# Patient Record
Sex: Male | Born: 1960 | Hispanic: No | Marital: Married | Smoking: Never smoker
Health system: Southern US, Community
[De-identification: ages and names within clinical notes are randomized; demographics above are authoritative.]

## PROBLEM LIST (undated history)

## (undated) DIAGNOSIS — G4730 Sleep apnea, unspecified: Secondary | ICD-10-CM

## (undated) HISTORY — PX: NO PAST SURGERIES: SHX2092

## (undated) HISTORY — PX: WRIST SURGERY: SHX841

---

## 2015-07-26 ENCOUNTER — Telehealth: Payer: Self-pay | Admitting: Internal Medicine

## 2015-07-26 NOTE — Telephone Encounter (Signed)
Pt called stated he was refer from Dr. Leeroy Bockhoudry to Dr. Yetta BarreJones as a new pt. Please advise.

## 2015-07-30 NOTE — Telephone Encounter (Signed)
yes

## 2015-07-31 NOTE — Telephone Encounter (Signed)
Left vm for pt to call back to make a new pt appt with Dr. Yetta BarreJones.

## 2015-08-28 ENCOUNTER — Encounter: Payer: Self-pay | Admitting: Internal Medicine

## 2018-06-18 DIAGNOSIS — Z01818 Encounter for other preprocedural examination: Secondary | ICD-10-CM

## 2019-03-27 ENCOUNTER — Other Ambulatory Visit: Payer: Self-pay | Admitting: *Deleted

## 2019-03-27 DIAGNOSIS — Z1152 Encounter for screening for COVID-19: Secondary | ICD-10-CM

## 2019-03-28 LAB — NOVEL CORONAVIRUS, NAA: SARS-CoV-2, NAA: DETECTED — AB

## 2019-03-28 LAB — SPECIMEN STATUS REPORT

## 2019-03-29 ENCOUNTER — Telehealth: Payer: Self-pay | Admitting: Family Medicine

## 2019-03-29 NOTE — Telephone Encounter (Signed)
Attempted to notify patient of positive COVID 19 results, no answer.   COVID-19 Labs  No results for input(s): DDIMER, FERRITIN, LDH, CRP in the last 72 hours.  Lab Results  Component Value Date   SARSCOV2NAA Detected (A) 03/27/2019    Nolon Nations  APRN, MSN, FNP-C [facility] [address] [place], [place] [address] [phone]

## 2019-04-02 ENCOUNTER — Emergency Department (HOSPITAL_COMMUNITY)
Admission: EM | Admit: 2019-04-02 | Discharge: 2019-04-03 | Disposition: A | Discharge status: Home / Self Care | Payer: BC Managed Care – PPO | Attending: Emergency Medicine | Admitting: Emergency Medicine

## 2019-04-02 ENCOUNTER — Emergency Department (HOSPITAL_COMMUNITY): Payer: BC Managed Care – PPO

## 2019-04-02 ENCOUNTER — Encounter (HOSPITAL_COMMUNITY): Payer: Self-pay | Admitting: Emergency Medicine

## 2019-04-02 ENCOUNTER — Other Ambulatory Visit: Payer: Self-pay

## 2019-04-02 DIAGNOSIS — J029 Acute pharyngitis, unspecified: Secondary | ICD-10-CM | POA: Diagnosis present

## 2019-04-02 DIAGNOSIS — Z79899 Other long term (current) drug therapy: Secondary | ICD-10-CM | POA: Diagnosis not present

## 2019-04-02 DIAGNOSIS — U071 COVID-19: Secondary | ICD-10-CM | POA: Diagnosis not present

## 2019-04-02 DIAGNOSIS — Z7982 Long term (current) use of aspirin: Secondary | ICD-10-CM | POA: Insufficient documentation

## 2019-04-02 LAB — CBC WITH DIFFERENTIAL/PLATELET
Abs Immature Granulocytes: 0.05 10*3/uL (ref 0.00–0.07)
Basophils Absolute: 0 10*3/uL (ref 0.0–0.1)
Basophils Relative: 0 %
Eosinophils Absolute: 0 10*3/uL (ref 0.0–0.5)
Eosinophils Relative: 0 %
HCT: 46.4 % (ref 39.0–52.0)
Hemoglobin: 16 g/dL (ref 13.0–17.0)
Immature Granulocytes: 1 %
Lymphocytes Relative: 15 %
Lymphs Abs: 1.3 10*3/uL (ref 0.7–4.0)
MCH: 28.7 pg (ref 26.0–34.0)
MCHC: 34.5 g/dL (ref 30.0–36.0)
MCV: 83.3 fL (ref 80.0–100.0)
Monocytes Absolute: 0.4 10*3/uL (ref 0.1–1.0)
Monocytes Relative: 4 %
Neutro Abs: 7.4 10*3/uL (ref 1.7–7.7)
Neutrophils Relative %: 80 %
Platelets: 230 10*3/uL (ref 150–400)
RBC: 5.57 MIL/uL (ref 4.22–5.81)
RDW: 13 % (ref 11.5–15.5)
WBC: 9.2 10*3/uL (ref 4.0–10.5)
nRBC: 0 % (ref 0.0–0.2)

## 2019-04-02 LAB — COMPREHENSIVE METABOLIC PANEL
ALT: 26 U/L (ref 0–44)
AST: 18 U/L (ref 15–41)
Albumin: 4.1 g/dL (ref 3.5–5.0)
Alkaline Phosphatase: 67 U/L (ref 38–126)
Anion gap: 10 (ref 5–15)
BUN: 19 mg/dL (ref 6–20)
CO2: 23 mmol/L (ref 22–32)
Calcium: 8.9 mg/dL (ref 8.9–10.3)
Chloride: 103 mmol/L (ref 98–111)
Creatinine, Ser: 1.15 mg/dL (ref 0.61–1.24)
GFR calc Af Amer: >60 mL/min (ref >60)
GFR calc non Af Amer: >60 mL/min (ref >60)
Glucose, Bld: 125 mg/dL — ABNORMAL HIGH (ref 70–99)
Potassium: 3.8 mmol/L (ref 3.5–5.1)
Sodium: 136 mmol/L (ref 135–145)
Total Bilirubin: 1 mg/dL (ref 0.3–1.2)
Total Protein: 7.1 g/dL (ref 6.5–8.1)

## 2019-04-02 LAB — FERRITIN: Ferritin: 156 ng/mL (ref 24–336)

## 2019-04-02 LAB — PROCALCITONIN: Procalcitonin: <0.1 ng/mL

## 2019-04-02 LAB — LACTATE DEHYDROGENASE: LDH: 120 U/L (ref 98–192)

## 2019-04-02 LAB — C-REACTIVE PROTEIN: CRP: 0.8 mg/dL (ref <1.0)

## 2019-04-02 LAB — FIBRINOGEN: Fibrinogen: 446 mg/dL (ref 210–475)

## 2019-04-02 LAB — D-DIMER, QUANTITATIVE: D-Dimer, Quant: <0.27 ug/mL-FEU (ref 0.00–0.50)

## 2019-04-02 LAB — LACTIC ACID, PLASMA: Lactic Acid, Venous: 1 mmol/L (ref 0.5–1.9)

## 2019-04-02 LAB — TRIGLYCERIDES: Triglycerides: 62 mg/dL (ref <150)

## 2019-04-02 NOTE — ED Notes (Signed)
Pt ambulated independently with steady gait from stretcher to hospital stretcher

## 2019-04-02 NOTE — ED Provider Notes (Signed)
[facility]-EMERGENCY DEPT Provider Note   CSN: [id number] Arrival date & time: 04/02/19  2000     History Chief Complaint  Patient presents with  . Covid Symptoms    Gordon Rosales is a 59 y.o. male.  He has a history of bladder cancer.  He was exposed to Covid on January 2 and a few days later became symptomatic with sore throat.  His Covid test was positive 1 week ago.  He is complaining of continued cough shortness of breath fevers myalgias arthralgias.  He is primary care doctor is put him on Zithromax Decadron zinc vitamin C. he continues to worsen with more shortness of breath and cough and he said his saturations dropped to 91% today.  He had a CAT scan today with his primary care doctor in [place] who told him he has bilateral pneumonia.  They recommended he come to the emergency department.   The history is provided by the patient.  Shortness of Breath Severity:  Severe Onset quality:  Gradual Timing:  Intermittent Progression:  Worsening Chronicity:  New Context comment:  Covid Relieved by:  Nothing Worsened by:  Activity and coughing Ineffective treatments:  Rest Associated symptoms: cough, fever, headaches and sore throat   Associated symptoms: no abdominal pain, no chest pain and no rash        History reviewed. No pertinent past medical history. Bladder CA  There are no problems to display for this patient.   History reviewed. No pertinent surgical history.     No family history on file.  Social History   Tobacco Use  . Smoking status: Not on file  Substance Use Topics  . Alcohol use: Not on file  . Drug use: Not on file    Home Medications Prior to Admission medications   Medication Sig Start Date End Date Taking? Authorizing Provider  acetaminophen (TYLENOL) 325 MG tablet Take 650 mg by mouth every 6 (six) hours as needed for moderate pain.   Yes [provider]  Ascorbic Acid (VITAMIN C WITH ROSE HIPS) 500 MG  tablet Take 500 mg by mouth daily.   Yes [provider]  aspirin EC 81 MG tablet Take 81 mg by mouth daily.   Yes [provider]  azithromycin (ZITHROMAX) 500 MG tablet Take 500 mg by mouth daily. 04/02/19  Yes [provider]  D3-50 1.25 MG (50000 UT) capsule Take 50,000 Units by mouth once a week. 02/16/19  Yes [provider]  dexamethasone (DECADRON) 6 MG tablet Take 6 mg by mouth daily.  03/30/19  Yes [provider]  finasteride (PROSCAR) 5 MG tablet Take 5 mg by mouth daily.  03/03/19 06/01/19 Yes [provider]  solifenacin (VESICARE) 5 MG tablet Take 5 mg by mouth daily. 03/17/19  Yes [provider]  Zinc 50 MG TABS Take 50 mg by mouth daily.   Yes [provider]    Allergies    Penicillins  Review of Systems   Review of Systems  Constitutional: Positive for fever.  HENT: Positive for sore throat.   Eyes: Negative for visual disturbance.  Respiratory: Positive for cough and shortness of breath.   Cardiovascular: Negative for chest pain.  Gastrointestinal: Negative for abdominal pain.  Genitourinary: Negative for dysuria.  Musculoskeletal: Positive for myalgias.  Skin: Negative for rash.  Neurological: Positive for headaches.    Physical Exam Updated Vital Signs BP 120/77 (BP Location: Left Arm)   Pulse 61   Temp 98.6 F (  37 C) (Oral)   Resp 20   Ht 6\' 3"  (1.905 m)   Wt 106.6 kg   SpO2 97%   BMI 29.37 kg/m   Physical Exam Vitals and nursing note reviewed.  Constitutional:      Appearance: He is well-developed.  HENT:     Head: Normocephalic and atraumatic.  Eyes:     Conjunctiva/sclera: Conjunctivae normal.  Cardiovascular:     Rate and Rhythm: Normal rate and regular rhythm.     Pulses: Normal pulses.     Heart sounds: No murmur.  Pulmonary:     Effort: Pulmonary effort is normal. No respiratory distress.     Breath sounds: Normal breath sounds.  Abdominal:     Palpations: Abdomen  is soft.     Tenderness: There is no abdominal tenderness.  Musculoskeletal:        General: Normal range of motion.     Cervical back: Neck supple.     Right lower leg: No edema.     Left lower leg: No edema.  Skin:    General: Skin is warm and dry.     Capillary Refill: Capillary refill takes less than 2 seconds.  Neurological:     General: No focal deficit present.     Mental Status: He is alert.     ED Results / Procedures / Treatments   Labs (all labs ordered are listed, but only abnormal results are displayed) Labs Reviewed  COMPREHENSIVE METABOLIC PANEL - Abnormal; Notable for the following components:      Result Value   Glucose, Bld 125 (*)    All other components within normal limits  CULTURE, BLOOD (ROUTINE X 2)  CULTURE, BLOOD (ROUTINE X 2)  LACTIC ACID, PLASMA  CBC WITH DIFFERENTIAL/PLATELET  D-DIMER, QUANTITATIVE (NOT AT [facility])  PROCALCITONIN  LACTATE DEHYDROGENASE  FERRITIN  TRIGLYCERIDES  FIBRINOGEN  C-REACTIVE PROTEIN    EKG EKG Interpretation  Date/Time:  Friday April 02 2019 20:39:39 EST Ventricular Rate:  58 PR Interval:    QRS Duration: 112 QT Interval:  432 QTC Calculation: 425 R Axis:   -25 Text Interpretation: Sinus rhythm Borderline intraventricular conduction delay No old tracing to compare Confirmed by 12-01-1972 [phone]) on 04/02/2019 9:32:44 PM   Radiology DG Chest Port 1 View  Result Date: 04/02/2019 CLINICAL DATA:  Shortness of breath and history of COVID-19 positivity EXAM: PORTABLE CHEST 1 VIEW COMPARISON:  CT from 4 hours previous FINDINGS: Cardiac shadow is stable. The lungs are well aerated bilaterally. The hazy opacity seen on recent CT are not well appreciated on this exam consistent with their mild severity. No bony abnormality is seen. IMPRESSION: No acute abnormality is noted. The previously seen hazy opacities consistent with the given clinical history were mild in severity on recent CT. Electronically Signed   By:  04/04/2019 M.D.   On: 04/02/2019 21:44    Procedures Procedures (including critical care time)  Medications Ordered in ED Medications - No data to display  ED Course  I have reviewed the triage vital signs and the nursing notes.  Pertinent labs & imaging results that were available during my care of the patient were reviewed by me and considered in my medical decision making (see chart for details).  Clinical Course as of Apr 03 1015  Fri Apr 02, 2019  [id number] 59 year old male with history of bladder cancer here with recent Covid positive and worsening shortness of breath symptoms.  He is currently on steroids and had a  CTA chest today that showed no evidence of PE but hazy opacities consistent with atypical pneumonia/Covid   [MB]  Sat Apr 03, 2019  0006 All the patient's inflammatory markers are unremarkable and his oxygen saturations have been 95 to 100%.  I reviewed the work-up with him.  I will call into the [facility] infusion center with his medical record number to see if he be a candidate for plasma.   [MB]    Clinical Course User Index [MB] Hayden Rasmussen, MD   MDM Rules/Calculators/A&P                     Gordon Rosales was evaluated in Emergency Department on 04/02/2019 for the symptoms described in the history of present illness. He was evaluated in the context of the global COVID-19 pandemic, which necessitated consideration that the patient might be at risk for infection with the SARS-CoV-2 virus that causes COVID-19. Institutional protocols and algorithms that pertain to the evaluation of patients at risk for COVID-19 are in a state of rapid change based on information released by regulatory bodies including the CDC and federal and state organizations. These policies and algorithms were followed during the patient's care in the ED.   Final Clinical Impression(s) / ED Diagnoses Final diagnoses:  COVID-19 virus infection    Rx / DC Orders ED Discharge Orders     None       Hayden Rasmussen, MD 04/03/19 1018

## 2019-04-02 NOTE — ED Triage Notes (Signed)
Pt diagnosed COVID+ x 1 week ago. Pt today was diagnosised with bilateral pneumonia by PCP. Pt stated to EMS "I want to go to hospital to have lab work done". Pt is being treated for pneumonia by PCP.   110 70 60 HR 20 R 98% RA 97.7

## 2019-04-02 NOTE — ED Notes (Signed)
X-ray at bedside

## 2019-04-03 ENCOUNTER — Telehealth: Payer: Self-pay | Admitting: Physician Assistant

## 2019-04-03 DIAGNOSIS — Z8551 Personal history of malignant neoplasm of bladder: Secondary | ICD-10-CM

## 2019-04-03 DIAGNOSIS — U071 COVID-19: Secondary | ICD-10-CM | POA: Insufficient documentation

## 2019-04-03 NOTE — Telephone Encounter (Signed)
Called to discuss with Gordon Rosales about Covid symptoms and the use of bamlanivimab, a monoclonal antibody infusion for those with mild to moderate Covid symptoms and at a high risk of hospitalization.    Pt does not qualify for infusion therapy as symptoms first presented > 10 days prior to timing of infusion. Symptoms tier reviewed as well as criteria for ending isolation. Preventative practices reviewed. Patient verbalized understanding.  The patient has been treated with steroid and azithromycin by PCP. He was seen in the ER last night for worsening symptoms but did not meet criteria for admission. He is still feeling quite poorly with fevers, myalgia and cough. I offered to make him an appointment in our respiratory clinic if he has clinical worsening or feels like he needs to be reevaluated in person. He asked me to CC Dr. Charm Barges from the ER and his PCP, Dr. Marcellus Scott.    Patient Active Problem List   Diagnosis Date Noted   History of bladder cancer 04/03/2019   COVID-19 virus detected 04/03/2019    Cline Crock PA-C  [facility]

## 2019-04-03 NOTE — Discharge Instructions (Addendum)
You were evaluated in the emergency department for worsening symptoms related to your Covid infection.  Your blood work showed that your inflammatory markers were in the normal range and your oxygen was 95 to 100%.  I placed a referral into the [facility] Covid infusion center to see if you would be a candidate for plasma infusion.  You can reach their clinic at [phone] and leave a message.   Please continue to manage fevers and pain with Tylenol and drink plenty of fluids.  Return to the emergency department if any worsening shortness of breath or other concerning symptoms.

## 2019-04-05 ENCOUNTER — Telehealth: Payer: Self-pay | Admitting: Nurse Practitioner

## 2019-04-05 NOTE — Telephone Encounter (Signed)
Called to Discuss with patient about Covid symptoms and the use of bamlanivimab, a monoclonal antibody infusion for those with mild to moderate Covid symptoms and at a high risk of hospitalization.     Patient is more than 10 days out on symptoms. Does not meet criteria for infusion.

## 2019-04-06 ENCOUNTER — Other Ambulatory Visit: Payer: Self-pay | Admitting: Cardiology

## 2019-04-06 DIAGNOSIS — Z20822 Contact with and (suspected) exposure to covid-19: Secondary | ICD-10-CM

## 2019-04-07 LAB — CULTURE, BLOOD (ROUTINE X 2)
Culture: NO GROWTH
Culture: NO GROWTH
Special Requests: ADEQUATE
Special Requests: ADEQUATE

## 2019-04-07 LAB — NOVEL CORONAVIRUS, NAA: SARS-CoV-2, NAA: DETECTED — AB

## 2020-10-15 ENCOUNTER — Emergency Department (HOSPITAL_COMMUNITY)
Admission: EM | Admit: 2020-10-15 | Discharge: 2020-10-15 | Disposition: A | Discharge status: Home / Self Care | Payer: BC Managed Care – PPO | Attending: Emergency Medicine | Admitting: Emergency Medicine

## 2020-10-15 ENCOUNTER — Other Ambulatory Visit: Payer: Self-pay

## 2020-10-15 ENCOUNTER — Emergency Department (HOSPITAL_BASED_OUTPATIENT_CLINIC_OR_DEPARTMENT_OTHER): Payer: BC Managed Care – PPO

## 2020-10-15 ENCOUNTER — Emergency Department (HOSPITAL_COMMUNITY): Payer: BC Managed Care – PPO

## 2020-10-15 ENCOUNTER — Encounter (HOSPITAL_COMMUNITY): Payer: Self-pay | Admitting: Student

## 2020-10-15 DIAGNOSIS — M79604 Pain in right leg: Secondary | ICD-10-CM | POA: Diagnosis not present

## 2020-10-15 DIAGNOSIS — Z8616 Personal history of COVID-19: Secondary | ICD-10-CM | POA: Insufficient documentation

## 2020-10-15 DIAGNOSIS — Z8551 Personal history of malignant neoplasm of bladder: Secondary | ICD-10-CM | POA: Insufficient documentation

## 2020-10-15 DIAGNOSIS — M79661 Pain in right lower leg: Secondary | ICD-10-CM | POA: Diagnosis not present

## 2020-10-15 DIAGNOSIS — R918 Other nonspecific abnormal finding of lung field: Secondary | ICD-10-CM

## 2020-10-15 DIAGNOSIS — R911 Solitary pulmonary nodule: Secondary | ICD-10-CM | POA: Diagnosis not present

## 2020-10-15 DIAGNOSIS — R001 Bradycardia, unspecified: Secondary | ICD-10-CM | POA: Insufficient documentation

## 2020-10-15 DIAGNOSIS — M7989 Other specified soft tissue disorders: Secondary | ICD-10-CM | POA: Diagnosis not present

## 2020-10-15 DIAGNOSIS — Z7982 Long term (current) use of aspirin: Secondary | ICD-10-CM | POA: Diagnosis not present

## 2020-10-15 DIAGNOSIS — R0602 Shortness of breath: Secondary | ICD-10-CM | POA: Diagnosis not present

## 2020-10-15 LAB — CBC WITH DIFFERENTIAL/PLATELET
Abs Immature Granulocytes: 0.03 10*3/uL (ref 0.00–0.07)
Basophils Absolute: 0 10*3/uL (ref 0.0–0.1)
Basophils Relative: 1 %
Eosinophils Absolute: 0.2 10*3/uL (ref 0.0–0.5)
Eosinophils Relative: 3 %
HCT: 47.5 % (ref 39.0–52.0)
Hemoglobin: 16 g/dL (ref 13.0–17.0)
Immature Granulocytes: 1 %
Lymphocytes Relative: 45 %
Lymphs Abs: 2.3 10*3/uL (ref 0.7–4.0)
MCH: 28.4 pg (ref 26.0–34.0)
MCHC: 33.7 g/dL (ref 30.0–36.0)
MCV: 84.2 fL (ref 80.0–100.0)
Monocytes Absolute: 0.4 10*3/uL (ref 0.1–1.0)
Monocytes Relative: 8 %
Neutro Abs: 2.2 10*3/uL (ref 1.7–7.7)
Neutrophils Relative %: 42 %
Platelets: 206 10*3/uL (ref 150–400)
RBC: 5.64 MIL/uL (ref 4.22–5.81)
RDW: 13.9 % (ref 11.5–15.5)
WBC: 5.2 10*3/uL (ref 4.0–10.5)
nRBC: 0 % (ref 0.0–0.2)

## 2020-10-15 LAB — BASIC METABOLIC PANEL
Anion gap: 7 (ref 5–15)
BUN: 22 mg/dL (ref 8–23)
CO2: 26 mmol/L (ref 22–32)
Calcium: 9.4 mg/dL (ref 8.9–10.3)
Chloride: 104 mmol/L (ref 98–111)
Creatinine, Ser: 1.21 mg/dL (ref 0.61–1.24)
GFR, Estimated: >60 mL/min (ref >60)
Glucose, Bld: 104 mg/dL — ABNORMAL HIGH (ref 70–99)
Potassium: 4.2 mmol/L (ref 3.5–5.1)
Sodium: 137 mmol/L (ref 135–145)

## 2020-10-15 LAB — TROPONIN I (HIGH SENSITIVITY)
Troponin I (High Sensitivity): 6 ng/L (ref <18)
Troponin I (High Sensitivity): 3 ng/L (ref <18)
Troponin I (High Sensitivity): 3 ng/L (ref <18)

## 2020-10-15 LAB — D-DIMER, QUANTITATIVE: D-Dimer, Quant: 0.48 ug/mL-FEU (ref 0.00–0.50)

## 2020-10-15 MED ORDER — IOHEXOL 350 MG/ML SOLN
100.0000 mL | Freq: Once | INTRAVENOUS | Status: AC | PRN
  Administered 2020-10-15: 100 mL via INTRAVENOUS

## 2020-10-15 NOTE — ED Provider Notes (Signed)
[facility] EMERGENCY DEPARTMENT Provider Note   CSN: [id number] Arrival date & time: 10/15/20  0130     History Chief Complaint  Patient presents with   Shortness of Breath    Gordon Rosales is a 60 y.o. male with a hx of bladder cancer on CBG therapy who presents to the ED with complaints of RLE pain/swelling x 3 days and intermittent dyspnea x 1 week. Patient reports that 3 days ago he drove from [place] to [place] which took about 13 hours with some stops, during the drive he started to experience RLE pain/swelling from the knee down therefore his wife took over driving. Since he has continued pain/swelling but it has seemed to improve with massaging the area. He has also had intermittent dyspnea with trouble clearing his throat/coughing over the past week, no specific triggers or alleviating/aggravating factors, he has had some chest heaviness with these sxs after eating last night which improved with OTC antiacid, sxs are not specific to exertion. He called his pulmonologist who instructed him to come to the ED for evaluation for possible blood clot. Recently had COVID 19 for the 2nd time diagnosed 09/19/20- received paxlovid and recovered well. He denies fever, nausea, vomiting, hemoptysis, syncope, hx of VTE, recent surgery, or recent trauma.   HPI     No past medical history on file.  Patient Active Problem List   Diagnosis Date Noted   History of bladder cancer 04/03/2019   COVID-19 virus detected 04/03/2019    No past surgical history on file.     No family history on file.     Home Medications Prior to Admission medications   Medication Sig Start Date End Date Taking? Authorizing Provider  acetaminophen (TYLENOL) 325 MG tablet Take 650 mg by mouth every 6 (six) hours as needed for moderate pain.    [provider]  Ascorbic Acid (VITAMIN C WITH ROSE HIPS) 500 MG tablet Take 500 mg by mouth daily.    [provider]   aspirin EC 81 MG tablet Take 81 mg by mouth daily.    [provider]  azithromycin (ZITHROMAX) 500 MG tablet Take 500 mg by mouth daily. 04/02/19   [provider]  D3-50 1.25 MG (50000 UT) capsule Take 50,000 Units by mouth once a week. 02/16/19   [provider]  dexamethasone (DECADRON) 6 MG tablet Take 6 mg by mouth daily.  03/30/19   [provider]  solifenacin (VESICARE) 5 MG tablet Take 5 mg by mouth daily. 03/17/19   [provider]  Zinc 50 MG TABS Take 50 mg by mouth daily.    [provider]    Allergies    Penicillins  Review of Systems   Review of Systems  Constitutional:  Negative for chills and fever.  Respiratory:  Positive for cough, chest tightness and shortness of breath.   Gastrointestinal:  Negative for abdominal pain, nausea and vomiting.  Musculoskeletal:  Positive for myalgias.  Neurological:  Negative for syncope.  All other systems reviewed and are negative.  Physical Exam Updated Vital Signs BP 117/87   Pulse (!) 50   Temp 98.6 F (37 C)   Resp 14   Ht 6' (1.829 m)   Wt 106.6 kg   SpO2 98%   BMI 31.87 kg/m   Physical Exam Vitals and nursing note reviewed.  Constitutional:      General: He is not in acute distress.    Appearance: He is well-developed. He  is not ill-appearing or toxic-appearing.  HENT:     Head: Normocephalic and atraumatic.  Eyes:     General:        Right eye: No discharge.        Left eye: No discharge.     Conjunctiva/sclera: Conjunctivae normal.  Cardiovascular:     Rate and Rhythm: Regular rhythm. Bradycardia present.     Pulses:          Dorsalis pedis pulses are 2+ on the right side and 2+ on the left side.       Posterior tibial pulses are 2+ on the right side and 2+ on the left side.  Pulmonary:     Effort: Pulmonary effort is normal. No respiratory distress.     Breath sounds: Normal breath sounds. No wheezing, rhonchi or rales.  Abdominal:     General:  There is no distension.     Palpations: Abdomen is soft.     Tenderness: There is no abdominal tenderness.  Musculoskeletal:     Cervical back: Neck supple.     Comments: Lower extremities: No obvious deformity, appreciable swelling, edema, erythema, ecchymosis, warmth, or open wounds. Patient has intact AROM to bilateral hips, knees, ankles, and all digits. Tender to palpation to the right popliteal fossa. Otherwise nontender. Compartments are soft.    Skin:    General: Skin is warm and dry.     Capillary Refill: Capillary refill takes less than 2 seconds.     Findings: No rash.  Neurological:     Mental Status: He is alert.     Comments: Alert. Clear speech. Sensation grossly intact to bilateral lower extremities. 5/5 strength with ankle plantar/dorsiflexion and knee flexion/extension bilaterally. Patient ambulatory   Psychiatric:        Mood and Affect: Mood normal.        Behavior: Behavior normal.    ED Results / Procedures / Treatments   Labs (all labs ordered are listed, but only abnormal results are displayed) Labs Reviewed  BASIC METABOLIC PANEL - Abnormal; Notable for the following components:      Result Value   Glucose, Bld 104 (*)    All other components within normal limits  CBC WITH DIFFERENTIAL/PLATELET  D-DIMER, QUANTITATIVE  TROPONIN I (HIGH SENSITIVITY)  TROPONIN I (HIGH SENSITIVITY)  TROPONIN I (HIGH SENSITIVITY)    EKG EKG Interpretation  Date/Time:  Sunday October 15 2020 01:58:21 EDT Ventricular Rate:  57 PR Interval:  152 QRS Duration: 110 QT Interval:  404 QTC Calculation: 393 R Axis:   99 Text Interpretation: Sinus bradycardia Rightward axis Borderline ECG No significant change since last tracing Confirmed by Alvira Monday ([id number]) on 10/15/2020 8:30:55 AM  Radiology DG Chest 2 View  Result Date: 10/15/2020 CLINICAL DATA:  Shortness of breath. EXAM: CHEST - 2 VIEW COMPARISON:  Chest x-ray 04/02/2019, CT chest 04/02/2019 FINDINGS: The heart size  and mediastinal contours are within normal limits. No focal consolidation. No pulmonary edema. No pleural effusion. No pneumothorax. No acute osseous abnormality. IMPRESSION: No active cardiopulmonary disease. Electronically Signed   By: Tish Frederickson M.D.   On: 10/15/2020 03:20   CT Angio Chest PE W/Cm &/Or Wo Cm  Result Date: 10/15/2020 CLINICAL DATA:  PE suspected. High probability. Shortness of breath reported on the chest x-ray from earlier today. EXAM: CT ANGIOGRAPHY CHEST WITH CONTRAST TECHNIQUE: Multidetector CT imaging of the chest was performed using the standard protocol during bolus administration of intravenous contrast. Multiplanar CT image reconstructions and MIPs  were obtained to evaluate the vascular anatomy. CONTRAST:  100mL OMNIPAQUE IOHEXOL 350 MG/ML SOLN COMPARISON:  Chest x-ray October 15, 2020. CT pulmonary angiogram April 02, 2019. FINDINGS: Cardiovascular: Satisfactory opacification of the pulmonary arteries to the segmental level. No evidence of pulmonary embolism. Normal heart size. No pericardial effusion. Mediastinum/Nodes: The thyroid and esophagus are normal. Mild gynecomastia. No adenopathy. Few calcified nodes are seen in the mediastinum and right hilum. No pleural or pericardial effusions. Lungs/Pleura: Central airways are normal. No pneumothorax. Several nodules in the right lung are stable measuring up to 5 mm. There are several calcified granuloma in the right lung as well. No new pulmonary nodules identified. No suspicious infiltrates. Upper Abdomen: Cholelithiasis. No other abnormalities in the upper abdomen. Musculoskeletal: No chest wall abnormality. No acute or significant osseous findings. Review of the MIP images confirms the above findings. IMPRESSION: 1. No pulmonary emboli identified. 2. No evidence of pneumonia. 3. Several small nodules in the right lung measure up to 5 mm, unchanged. There also several calcified granulomata in the right lung as well. No follow-up  needed if patient is low-risk (and has no known or suspected primary neoplasm). Non-contrast chest CT can be considered in 12 months if patient is high-risk. This recommendation follows the consensus statement: Guidelines for Management of Incidental Pulmonary Nodules Detected on CT Images: From the Fleischner Society 2017; Radiology 2017; 284:228-243. 4. Cholelithiasis. Electronically Signed   By: Gerome Samavid  Williams III M.D   On: 10/15/2020 10:28   VAS US LOWER EXTREMITY VENOUS (DVT) (ONLY [facility] & [facility] 7a-7p)  Result Date: 10/15/2020  Lower Venous DVT Study Patient Name:  Buren KosCHOUDRY [place]mouth  Date of Exam:   10/15/2020 Medical Rec #: [id number]       Accession #:    [id number] Date of Birth: 02/05/1961        Patient Gender: M Patient Age:   60Y [facility] Procedure:      VAS US LOWER EXTREMITY VENOUS (DVT) Referring Phys: [id number] [facility] Zeena Starkel --------------------------------------------------------------------------------  Indications: Recent long car trip, right lower extremity pain and swelling, shortness of breath.  Risk Factors: Negative d-dimer. Comparison Study: No prior study Performing Technologist: Gertie FeyMichelle Simonetti MHA, RDMS, RVT, RDCS  Examination Guidelines: A complete evaluation includes B-mode imaging, spectral Doppler, color Doppler, and power Doppler as needed of all accessible portions of each vessel. Bilateral testing is considered an integral part of a complete examination. Limited examinations for reoccurring indications may be performed as noted. The reflux portion of the exam is performed with the patient in reverse Trendelenburg.  +---------+---------------+---------+-----------+----------+--------------+ RIGHT    CompressibilityPhasicitySpontaneityPropertiesThrombus Aging +---------+---------------+---------+-----------+----------+--------------+ CFV      Full           Yes      Yes                                  +---------+---------------+---------+-----------+----------+--------------+ SFJ      Full                                                        +---------+---------------+---------+-----------+----------+--------------+ FV Prox  Full                                                        +---------+---------------+---------+-----------+----------+--------------+  FV Mid   Full                                                        +---------+---------------+---------+-----------+----------+--------------+ FV DistalFull                                                        +---------+---------------+---------+-----------+----------+--------------+ PFV      Full                                                        +---------+---------------+---------+-----------+----------+--------------+ POP      Full           Yes      Yes                                 +---------+---------------+---------+-----------+----------+--------------+ PTV      Full                                                        +---------+---------------+---------+-----------+----------+--------------+ PERO     Full                                                        +---------+---------------+---------+-----------+----------+--------------+   +----+---------------+---------+-----------+----------+--------------+ LEFTCompressibilityPhasicitySpontaneityPropertiesThrombus Aging +----+---------------+---------+-----------+----------+--------------+ CFV Full           Yes      Yes                                 +----+---------------+---------+-----------+----------+--------------+     Summary: RIGHT: - There is no evidence of deep vein thrombosis in the lower extremity.  - No cystic structure found in the popliteal fossa.  LEFT: - No evidence of common femoral vein obstruction.  *See table(s) above for measurements and observations.    Preliminary     Procedures Procedures    Medications Ordered in ED Medications - No data to display  ED Course  I have reviewed the triage vital signs and the nursing notes.  Pertinent labs & imaging results that were available during my care of the patient were reviewed by me and considered in my medical decision making (see chart for details).    MDM Rules/Calculators/A&P                           Patient presents to the ED with complaints of RLE pain & dyspnea.  Nontoxic, mildly bradycardic, resting comfortably.   Additional history obtained:  Additional history obtained from chart review & nursing  note review.   EKG: Sinus bradycardia, rightward axis deviation, No significant change since last tracing  Lab Tests:  I reviewed and interpreted labs, which included:  CBC/BMP: Unremarkable.  D-dimer: WNL Troponin: Flat x 3.   Imaging Studies ordered:  CXR ordered in triage, I independently reviewed, formal radiology impression shows:  No active cardiopulmonary disease.   ED Course:  Discussed results thus far with the patient' and his wife whom is a psychiatrist via telephone- there was some concern that the patient was told by a staff member earlier that one of his "heart levels was elevated" he has had 3 high sensitivity troponins which have been within normal limits and without significant delta elevation and is no longer having any chest pain following antiacid prior to arrival- Patient & his wife were very upset with the incorrect information provided to them earlier in the waiting room, we re-went over results and they were reassured, but remain with concern for blood clots. Patient did have normal d-dimer- he has a high risk wells score for PE and moderate risk for DVT- plan to proceed RLE venous duplex & CTA of the chest per discussion w/ attending and shared decision making with patient and family.   RLE venous duplex: RIGHT: - There is no evidence of deep vein thrombosis in the lower extremity.  - No cystic  structure found in the popliteal fossa.  LEFT: - No evidence of common femoral vein obstruction. CTA: 1. No pulmonary emboli identified. 2. No evidence of pneumonia. 3. Several small nodules in the right lung measure up to 5 mm, unchanged. There also several calcified granulomata in the right lung as well. 4. Cholelithiasis.    Heart pathway score low risk, troponins are flat, pain is not exertional, and EKG is without ischemia- feel that ACS is unlikely.  CTA without PE, pneumonia, or fluid overload. I discussed granulomata & nodules with the patient from his chest CT, he does have a hx of bladder cancer- we discussed need to for very close follow up with his outpatient providers (PCP/urology).  Also recommended pulmonology follow up with his intermittent dyspnea. No hypoxia or respiratory distress.  RLE without erythema or increased warmth, patient is afebrile- does not seem consistent w/ infectious process such as septic joint or cellulitis. 2+ symmetric DP/PT pulses, well perfused, does not seem consistent w/ arterial ischemia. RLE venous duplex negative for DVT. No direct trauma or bony tenderness to raise concern for fx/dislocation. NVI distally. Possibly muscular- improving with massaging at home.   Overall reassuring ED work-up. Patient appears appropriate for discharge home with his outpatient providers. I discussed results, treatment plan, need for follow-up, and return precautions with the patient. Provided opportunity for questions, patient confirmed understanding and is in agreement with plan.   Findings and plan of care discussed with supervising physician Dr. Dalene Seltzer who is in agreement.    Portions of this note were generated with Scientist, clinical (histocompatibility and immunogenetics). Dictation errors may occur despite best attempts at proofreading.  Final Clinical Impression(s) / ED Diagnoses Final diagnoses:  Shortness of breath  Right leg pain  Pulmonary nodules    Rx / DC Orders ED Discharge Orders      None        Cherly Anderson, PA-C 10/15/20 1116    Alvira Monday, MD 10/15/20 2354

## 2020-10-15 NOTE — ED Triage Notes (Signed)
Pt presents with shob and a heavy feeling in chest.  Pt states he has had covid 2 times, most recently in June, and has seen a pulmonologist.  Had a long road trip back from [place] and has experienced right lower leg swelling and shob.  Called his pulmonary physician and told to come to ED for eval of clot.

## 2020-10-15 NOTE — Discharge Instructions (Addendum)
You were seen in the emergency department today for right leg pain as well as trouble breathing.  Your work-up in the emergency department was overall reassuring.  Your results are detailed below.  Your EKG appeared similar to prior EKGs you have had done and your heart enzymes (troponin- high sensitivity) were normal.  Your additional labs did not show any significant abnormalities.  Your right lower extremity ultrasound did not show a blood clot.  Your CT scan did not show a blood clot, however your CT scan did show nodules/granulomata which have been present on prior imaging, given these findings and your history of bladder cancer it is very important that you have this closely followed up, please discuss with your primary care provider and/or your urologist for close follow-up repeat imaging of your chest. Please also follow-up with your pulmonologist given your intermittent trouble breathing.  Your oxygen saturations were reassuring in the emergency department.  Return to the emergency department for any new or worsening symptoms including but not limited to new or worsening pain, increased or persistent trouble breathing, passing out, coughing up blood, fever, discoloration of your extremities, numbness, weakness, or any other concerns  Results for orders placed or performed during the hospital encounter of 10/15/20  CBC with Differential  Result Value Ref Range   WBC 5.2 4.0 - 10.5 K/uL   RBC 5.64 4.22 - 5.81 MIL/uL   Hemoglobin 16.0 13.0 - 17.0 g/dL   HCT 84.1 32.4 - 40.1 %   MCV 84.2 80.0 - 100.0 fL   MCH 28.4 26.0 - 34.0 pg   MCHC 33.7 30.0 - 36.0 g/dL   RDW 02.7 25.3 - 66.4 %   Platelets 206 150 - 400 K/uL   nRBC 0.0 0.0 - 0.2 %   Neutrophils Relative % 42 %   Neutro Abs 2.2 1.7 - 7.7 K/uL   Lymphocytes Relative 45 %   Lymphs Abs 2.3 0.7 - 4.0 K/uL   Monocytes Relative 8 %   Monocytes Absolute 0.4 0.1 - 1.0 K/uL   Eosinophils Relative 3 %   Eosinophils Absolute 0.2 0.0 - 0.5 K/uL    Basophils Relative 1 %   Basophils Absolute 0.0 0.0 - 0.1 K/uL   Immature Granulocytes 1 %   Abs Immature Granulocytes 0.03 0.00 - 0.07 K/uL  Basic metabolic panel  Result Value Ref Range   Sodium 137 135 - 145 mmol/L   Potassium 4.2 3.5 - 5.1 mmol/L   Chloride 104 98 - 111 mmol/L   CO2 26 22 - 32 mmol/L   Glucose, Bld 104 (H) 70 - 99 mg/dL   BUN 22 8 - 23 mg/dL   Creatinine, Ser 4.03 0.61 - 1.24 mg/dL   Calcium 9.4 8.9 - 47.4 mg/dL   GFR, Estimated >25 >95 mL/min   Anion gap 7 5 - 15  D-dimer, quantitative  Result Value Ref Range   D-Dimer, Quant 0.48 0.00 - 0.50 ug/mL-FEU  Troponin I (High Sensitivity)  Result Value Ref Range   Troponin I (High Sensitivity) 6 <18 ng/L  Troponin I (High Sensitivity)  Result Value Ref Range   Troponin I (High Sensitivity) 3 <18 ng/L  Troponin I (High Sensitivity)  Result Value Ref Range   Troponin I (High Sensitivity) 3 <18 ng/L   DG Chest 2 View  Result Date: 10/15/2020 CLINICAL DATA:  Shortness of breath. EXAM: CHEST - 2 VIEW COMPARISON:  Chest x-ray 04/02/2019, CT chest 04/02/2019 FINDINGS: The heart size and mediastinal contours are within normal  limits. No focal consolidation. No pulmonary edema. No pleural effusion. No pneumothorax. No acute osseous abnormality. IMPRESSION: No active cardiopulmonary disease. Electronically Signed   By: Tish Frederickson M.D.   On: 10/15/2020 03:20   CT Angio Chest PE W/Cm &/Or Wo Cm  Result Date: 10/15/2020 CLINICAL DATA:  PE suspected. High probability. Shortness of breath reported on the chest x-ray from earlier today. EXAM: CT ANGIOGRAPHY CHEST WITH CONTRAST TECHNIQUE: Multidetector CT imaging of the chest was performed using the standard protocol during bolus administration of intravenous contrast. Multiplanar CT image reconstructions and MIPs were obtained to evaluate the vascular anatomy. CONTRAST:  OMNIPAQUE IOHEXOL 350 MG/ML SOLN COMPARISON:  Chest x-ray October 15, 2020. CT pulmonary angiogram  April 02, 2019. FINDINGS: Cardiovascular: Satisfactory opacification of the pulmonary arteries to the segmental level. No evidence of pulmonary embolism. Normal heart size. No pericardial effusion. Mediastinum/Nodes: The thyroid and esophagus are normal. Mild gynecomastia. No adenopathy. Few calcified nodes are seen in the mediastinum and right hilum. No pleural or pericardial effusions. Lungs/Pleura: Central airways are normal. No pneumothorax. Several nodules in the right lung are stable measuring up to 5 mm. There are several calcified granuloma in the right lung as well. No new pulmonary nodules identified. No suspicious infiltrates. Upper Abdomen: Cholelithiasis. No other abnormalities in the upper abdomen. Musculoskeletal: No chest wall abnormality. No acute or significant osseous findings. Review of the MIP images confirms the above findings. IMPRESSION: 1. No pulmonary emboli identified. 2. No evidence of pneumonia. 3. Several small nodules in the right lung measure up to 5 mm, unchanged. There also several calcified granulomata in the right lung as well. No follow-up needed if patient is low-risk (and has no known or suspected primary neoplasm). Non-contrast chest CT can be considered in 12 months if patient is high-risk. This recommendation follows the consensus statement: Guidelines for Management of Incidental Pulmonary Nodules Detected on CT Images: From the Fleischner Society 2017; Radiology 2017; 284:228-243. 4. Cholelithiasis. Electronically Signed   By: Gerome Sam III M.D   On: 10/15/2020 10:28   VAS [place] LOWER EXTREMITY VENOUS (DVT) (ONLY [facility] & [facility] 7a-7p)  Result Date: 10/15/2020  Lower Venous DVT Study Patient Name:  Gordon Rosales  Date of Exam:   10/15/2020 Medical Rec #: [id number]       Accession #:    [id number] Date of Birth: 15-Oct-1960        Patient Gender: M Patient Age:   60Y Exam Location:  [facility] Procedure:      VAS [place] LOWER EXTREMITY VENOUS (DVT) Referring Phys:  [id number] [facility] R [name] --------------------------------------------------------------------------------  Indications: Recent long car trip, right lower extremity pain and swelling, shortness of breath.  Risk Factors: Negative d-dimer. Comparison Study: No prior study Performing Technologist: Gertie Fey MHA, RDMS, RVT, RDCS  Examination Guidelines: A complete evaluation includes B-mode imaging, spectral Doppler, color Doppler, and power Doppler as needed of all accessible portions of each vessel. Bilateral testing is considered an integral part of a complete examination. Limited examinations for reoccurring indications may be performed as noted. The reflux portion of the exam is performed with the patient in reverse Trendelenburg.  +---------+---------------+---------+-----------+----------+--------------+ RIGHT    CompressibilityPhasicitySpontaneityPropertiesThrombus Aging +---------+---------------+---------+-----------+----------+--------------+ CFV      Full           Yes      Yes                                 +---------+---------------+---------+-----------+----------+--------------+  SFJ      Full                                                        +---------+---------------+---------+-----------+----------+--------------+ FV Prox  Full                                                        +---------+---------------+---------+-----------+----------+--------------+ FV Mid   Full                                                        +---------+---------------+---------+-----------+----------+--------------+ FV DistalFull                                                        +---------+---------------+---------+-----------+----------+--------------+ PFV      Full                                                        +---------+---------------+---------+-----------+----------+--------------+ POP      Full           Yes      Yes                                  +---------+---------------+---------+-----------+----------+--------------+ PTV      Full                                                        +---------+---------------+---------+-----------+----------+--------------+ PERO     Full                                                        +---------+---------------+---------+-----------+----------+--------------+   +----+---------------+---------+-----------+----------+--------------+ LEFTCompressibilityPhasicitySpontaneityPropertiesThrombus Aging +----+---------------+---------+-----------+----------+--------------+ CFV Full           Yes      Yes                                 +----+---------------+---------+-----------+----------+--------------+     Summary: RIGHT: - There is no evidence of deep vein thrombosis in the lower extremity.  - No cystic structure found in the popliteal fossa.  LEFT: - No evidence of common femoral vein obstruction.  *See table(s) above for measurements and observations.    Preliminary

## 2020-10-15 NOTE — Progress Notes (Signed)
Right lower extremity venous duplex completed. Refer to "CV Proc" under chart review to view preliminary results.  10/15/2020 9:13 AM Eula Fried., MHA, RVT, RDCS, RDMS

## 2020-10-15 NOTE — ED Provider Notes (Addendum)
Emergency Medicine Provider Triage Evaluation Note  Gordon Rosales , a 60 y.o. male  was evaluated in triage.  Pt complains of SOB and right leg swelling.  Just returned from trip to [place] via car.  States he has been experiencing some intermittent right calf pain/swelling and chest pain.  Does report pain with deep breathing.  Denies history of DVT or PE.  He was sent in by his pulmonologist-- sees them due to covid issues.  Has had covid twice, most recent June 2022.  Review of Systems  Positive: SOB, calf pain/swelling Negative: fever  Physical Exam  BP 122/77 (BP Location: Left Arm)   Pulse (!) 56   Temp 97.6 F (36.4 C) (Oral)   Resp 20   SpO2 99%  Gen:   Awake, no distress   Resp:  Normal effort, lungs CTAB MSK:   Moves extremities without difficulty  Other:  No noted leg swelling, no calf tenderness elicited, no overlying skin changes, erythema, or warmth to touch  Medical Decision Making  Medically screening exam initiated at 1:58 AM.  Appropriate orders placed.  Gordon Rosales was informed that the remainder of the evaluation will be completed by another provider, this initial triage assessment does not replace that evaluation, and the importance of remaining in the ED until their evaluation is complete.  EKG, labs including d-dimer, CXR.  6:19 AM Initial troponin was reported at 286, however delta was 3.  Contacted lab about this-- they report this was an error and has re-run initial sample, initial trop was actually 8 NOT 286.  They have corrected results in Epic to reflect this as below.  Comment: CRITICAL RESULT CALLED TO, READ BACK BY AND VERIFIED WITH:  Gordon Rosales 10/15/20 0306 Gordon Rosales  CORRECTED RESULTS CALLED TO:  Gordon Rosales [facility] 10/15/20 8416 Gordon Rosales  Performed at [facility], [address], [place], [place] [address]  CORRECTED ON 07/31 AT 1601: PREVIOUSLY REPORTED AS 286 CRITICAL RESULT CALLED TO, READ BACK BY AND VERIFIED WITH: Gordon Rosales 10/15/20 0306 Gordon Rosales     Gordon Hatchet, Gordon Rosales 10/15/20 0204    Gordon Hatchet, Gordon Rosales 10/15/20 0207    Gordon Hatchet, Gordon Rosales 10/15/20 0932    Gordon Crease, MD 10/15/20 [phone]

## 2020-10-15 NOTE — ED Notes (Signed)
Pt complains of right leg swelling and pain that started  3 days ago while driving back from [place]. Pt also has chest pressure and tightness x 1 week. Denies fever, n/v/d, cough.

## 2021-01-23 ENCOUNTER — Other Ambulatory Visit: Payer: Self-pay | Admitting: Internal Medicine

## 2021-01-23 ENCOUNTER — Ambulatory Visit
Admission: RE | Admit: 2021-01-23 | Discharge: 2021-01-23 | Disposition: A | Discharge status: Home / Self Care | Payer: BC Managed Care – PPO | Source: Ambulatory Visit | Attending: Internal Medicine | Admitting: Internal Medicine

## 2021-01-23 DIAGNOSIS — M79604 Pain in right leg: Secondary | ICD-10-CM

## 2021-01-23 DIAGNOSIS — M25561 Pain in right knee: Secondary | ICD-10-CM

## 2021-01-24 ENCOUNTER — Other Ambulatory Visit: Payer: Self-pay | Admitting: Internal Medicine

## 2021-01-24 DIAGNOSIS — R1319 Other dysphagia: Secondary | ICD-10-CM

## 2021-01-26 ENCOUNTER — Ambulatory Visit
Admission: RE | Admit: 2021-01-26 | Discharge: 2021-01-26 | Disposition: A | Discharge status: Home / Self Care | Payer: BC Managed Care – PPO | Source: Ambulatory Visit | Attending: Internal Medicine | Admitting: Internal Medicine

## 2021-01-26 DIAGNOSIS — R1319 Other dysphagia: Secondary | ICD-10-CM

## 2021-04-16 DIAGNOSIS — R1310 Dysphagia, unspecified: Secondary | ICD-10-CM | POA: Diagnosis not present

## 2021-05-09 DIAGNOSIS — R918 Other nonspecific abnormal finding of lung field: Secondary | ICD-10-CM | POA: Diagnosis not present

## 2021-05-09 DIAGNOSIS — R059 Cough, unspecified: Secondary | ICD-10-CM | POA: Diagnosis not present

## 2021-05-09 DIAGNOSIS — R051 Acute cough: Secondary | ICD-10-CM | POA: Diagnosis not present

## 2021-05-09 DIAGNOSIS — G4733 Obstructive sleep apnea (adult) (pediatric): Secondary | ICD-10-CM | POA: Diagnosis not present

## 2021-05-09 DIAGNOSIS — Z03818 Encounter for observation for suspected exposure to other biological agents ruled out: Secondary | ICD-10-CM | POA: Diagnosis not present

## 2021-05-09 DIAGNOSIS — Z8616 Personal history of COVID-19: Secondary | ICD-10-CM | POA: Diagnosis not present

## 2021-05-09 DIAGNOSIS — B349 Viral infection, unspecified: Secondary | ICD-10-CM | POA: Diagnosis not present

## 2021-05-09 DIAGNOSIS — J4530 Mild persistent asthma, uncomplicated: Secondary | ICD-10-CM | POA: Diagnosis not present

## 2021-05-09 DIAGNOSIS — J189 Pneumonia, unspecified organism: Secondary | ICD-10-CM | POA: Diagnosis not present

## 2021-06-07 DIAGNOSIS — M1711 Unilateral primary osteoarthritis, right knee: Secondary | ICD-10-CM | POA: Diagnosis not present

## 2021-06-07 DIAGNOSIS — M2351 Chronic instability of knee, right knee: Secondary | ICD-10-CM | POA: Diagnosis not present

## 2021-06-08 ENCOUNTER — Other Ambulatory Visit: Payer: Self-pay | Admitting: Orthopedic Surgery

## 2021-06-08 DIAGNOSIS — R531 Weakness: Secondary | ICD-10-CM

## 2021-06-08 DIAGNOSIS — G8929 Other chronic pain: Secondary | ICD-10-CM

## 2021-06-14 DIAGNOSIS — Z8551 Personal history of malignant neoplasm of bladder: Secondary | ICD-10-CM | POA: Diagnosis not present

## 2021-06-14 DIAGNOSIS — C678 Malignant neoplasm of overlapping sites of bladder: Secondary | ICD-10-CM | POA: Diagnosis not present

## 2021-06-14 DIAGNOSIS — Z08 Encounter for follow-up examination after completed treatment for malignant neoplasm: Secondary | ICD-10-CM | POA: Diagnosis not present

## 2021-06-27 ENCOUNTER — Ambulatory Visit
Admission: RE | Admit: 2021-06-27 | Discharge: 2021-06-27 | Disposition: A | Discharge status: Home / Self Care | Payer: BC Managed Care – PPO | Source: Ambulatory Visit | Attending: Orthopedic Surgery | Admitting: Orthopedic Surgery

## 2021-06-27 DIAGNOSIS — M25561 Pain in right knee: Secondary | ICD-10-CM | POA: Diagnosis not present

## 2021-06-27 DIAGNOSIS — R531 Weakness: Secondary | ICD-10-CM

## 2021-06-27 DIAGNOSIS — G8929 Other chronic pain: Secondary | ICD-10-CM

## 2021-07-03 DIAGNOSIS — M1711 Unilateral primary osteoarthritis, right knee: Secondary | ICD-10-CM | POA: Diagnosis not present

## 2021-07-03 DIAGNOSIS — S83241A Other tear of medial meniscus, current injury, right knee, initial encounter: Secondary | ICD-10-CM | POA: Diagnosis not present

## 2021-07-24 DIAGNOSIS — H43811 Vitreous degeneration, right eye: Secondary | ICD-10-CM | POA: Diagnosis not present

## 2021-08-27 DIAGNOSIS — R942 Abnormal results of pulmonary function studies: Secondary | ICD-10-CM | POA: Diagnosis not present

## 2021-08-27 DIAGNOSIS — G4733 Obstructive sleep apnea (adult) (pediatric): Secondary | ICD-10-CM | POA: Diagnosis not present

## 2021-08-27 DIAGNOSIS — Z9989 Dependence on other enabling machines and devices: Secondary | ICD-10-CM | POA: Diagnosis not present

## 2021-08-27 DIAGNOSIS — R0602 Shortness of breath: Secondary | ICD-10-CM | POA: Diagnosis not present

## 2021-08-27 DIAGNOSIS — R918 Other nonspecific abnormal finding of lung field: Secondary | ICD-10-CM | POA: Diagnosis not present

## 2021-10-24 DIAGNOSIS — L729 Follicular cyst of the skin and subcutaneous tissue, unspecified: Secondary | ICD-10-CM | POA: Diagnosis not present

## 2021-10-24 DIAGNOSIS — R21 Rash and other nonspecific skin eruption: Secondary | ICD-10-CM | POA: Diagnosis not present

## 2021-10-24 DIAGNOSIS — I872 Venous insufficiency (chronic) (peripheral): Secondary | ICD-10-CM | POA: Diagnosis not present

## 2021-10-24 DIAGNOSIS — M7989 Other specified soft tissue disorders: Secondary | ICD-10-CM | POA: Diagnosis not present

## 2021-10-25 ENCOUNTER — Other Ambulatory Visit (HOSPITAL_COMMUNITY): Payer: Self-pay | Admitting: Internal Medicine

## 2021-10-25 ENCOUNTER — Other Ambulatory Visit: Payer: Self-pay | Admitting: Internal Medicine

## 2021-10-25 ENCOUNTER — Ambulatory Visit (HOSPITAL_COMMUNITY)
Admission: RE | Admit: 2021-10-25 | Discharge: 2021-10-25 | Disposition: A | Discharge status: Home / Self Care | Payer: BC Managed Care – PPO | Source: Ambulatory Visit | Attending: Cardiology | Admitting: Cardiology

## 2021-10-25 DIAGNOSIS — I872 Venous insufficiency (chronic) (peripheral): Secondary | ICD-10-CM | POA: Diagnosis not present

## 2021-12-13 DIAGNOSIS — Z8551 Personal history of malignant neoplasm of bladder: Secondary | ICD-10-CM | POA: Diagnosis not present

## 2021-12-13 DIAGNOSIS — Z08 Encounter for follow-up examination after completed treatment for malignant neoplasm: Secondary | ICD-10-CM | POA: Diagnosis not present

## 2021-12-17 DIAGNOSIS — Z03818 Encounter for observation for suspected exposure to other biological agents ruled out: Secondary | ICD-10-CM | POA: Diagnosis not present

## 2021-12-17 DIAGNOSIS — J4530 Mild persistent asthma, uncomplicated: Secondary | ICD-10-CM | POA: Diagnosis not present

## 2021-12-17 DIAGNOSIS — R918 Other nonspecific abnormal finding of lung field: Secondary | ICD-10-CM | POA: Diagnosis not present

## 2021-12-17 DIAGNOSIS — R070 Pain in throat: Secondary | ICD-10-CM | POA: Diagnosis not present

## 2021-12-17 DIAGNOSIS — M791 Myalgia, unspecified site: Secondary | ICD-10-CM | POA: Diagnosis not present

## 2021-12-17 DIAGNOSIS — G4733 Obstructive sleep apnea (adult) (pediatric): Secondary | ICD-10-CM | POA: Diagnosis not present

## 2021-12-17 DIAGNOSIS — Z8616 Personal history of COVID-19: Secondary | ICD-10-CM | POA: Diagnosis not present

## 2022-01-01 DIAGNOSIS — R059 Cough, unspecified: Secondary | ICD-10-CM | POA: Diagnosis not present

## 2022-01-08 ENCOUNTER — Other Ambulatory Visit (HOSPITAL_COMMUNITY): Payer: Self-pay | Admitting: Specialist

## 2022-01-08 ENCOUNTER — Other Ambulatory Visit (HOSPITAL_BASED_OUTPATIENT_CLINIC_OR_DEPARTMENT_OTHER): Payer: Self-pay | Admitting: Specialist

## 2022-01-08 ENCOUNTER — Ambulatory Visit (HOSPITAL_COMMUNITY)
Admission: RE | Admit: 2022-01-08 | Discharge: 2022-01-08 | Disposition: A | Discharge status: Home / Self Care | Payer: BC Managed Care – PPO | Source: Ambulatory Visit | Attending: Specialist | Admitting: Specialist

## 2022-01-08 DIAGNOSIS — I2699 Other pulmonary embolism without acute cor pulmonale: Secondary | ICD-10-CM

## 2022-01-08 MED ORDER — IOHEXOL 350 MG/ML SOLN
75.0000 mL | Freq: Once | INTRAVENOUS | Status: AC | PRN
Start: 2022-01-08 — End: 2022-01-08
  Administered 2022-01-08: 75 mL via INTRAVENOUS

## 2022-01-30 DIAGNOSIS — R918 Other nonspecific abnormal finding of lung field: Secondary | ICD-10-CM | POA: Diagnosis not present

## 2022-01-30 DIAGNOSIS — G4733 Obstructive sleep apnea (adult) (pediatric): Secondary | ICD-10-CM | POA: Diagnosis not present

## 2022-01-30 DIAGNOSIS — J4520 Mild intermittent asthma, uncomplicated: Secondary | ICD-10-CM | POA: Diagnosis not present

## 2022-01-30 DIAGNOSIS — Z8616 Personal history of COVID-19: Secondary | ICD-10-CM | POA: Diagnosis not present

## 2022-02-13 DIAGNOSIS — D3131 Benign neoplasm of right choroid: Secondary | ICD-10-CM | POA: Diagnosis not present

## 2022-03-14 DIAGNOSIS — D171 Benign lipomatous neoplasm of skin and subcutaneous tissue of trunk: Secondary | ICD-10-CM | POA: Diagnosis not present

## 2022-03-21 DIAGNOSIS — Z0000 Encounter for general adult medical examination without abnormal findings: Secondary | ICD-10-CM | POA: Diagnosis not present

## 2022-03-21 DIAGNOSIS — E669 Obesity, unspecified: Secondary | ICD-10-CM | POA: Diagnosis not present

## 2022-03-21 DIAGNOSIS — Z13220 Encounter for screening for lipoid disorders: Secondary | ICD-10-CM | POA: Diagnosis not present

## 2022-03-21 DIAGNOSIS — Z125 Encounter for screening for malignant neoplasm of prostate: Secondary | ICD-10-CM | POA: Diagnosis not present

## 2022-03-21 DIAGNOSIS — Z23 Encounter for immunization: Secondary | ICD-10-CM | POA: Diagnosis not present

## 2022-04-01 ENCOUNTER — Encounter (HOSPITAL_BASED_OUTPATIENT_CLINIC_OR_DEPARTMENT_OTHER): Payer: Self-pay | Admitting: Surgery

## 2022-04-01 NOTE — Progress Notes (Signed)
Spoke w/ via phone for pre-op interview--- [facility] needs dos----NONE               Lab results------ COVID test -----patient states asymptomatic no test needed Arrive at -------0700 NPO after MN NO Solid Food.  Med rec completed Medications to take morning of surgery ----- Albuterol PRN Diabetic medication ----- Patient instructed no nail polish to be worn day of surgery Patient instructed to bring photo id and insurance card day of surgery Patient aware to have Driver (ride ) / caregiver  Unsure of who driver will be but verbalized understanding he had to have ride home.  for 24 hours after surgery  Patient Special Instructions ----- Pre-Op special Istructions ----- Patient verbalized understanding of instructions that were given at this phone interview. Patient denies shortness of breath, chest pain, fever, cough at this phone interview.

## 2022-04-04 ENCOUNTER — Ambulatory Visit: Payer: Self-pay | Admitting: Surgery

## 2022-04-05 ENCOUNTER — Ambulatory Visit (HOSPITAL_BASED_OUTPATIENT_CLINIC_OR_DEPARTMENT_OTHER): Payer: BC Managed Care – PPO | Admitting: Certified Registered Nurse Anesthetist

## 2022-04-05 ENCOUNTER — Encounter (HOSPITAL_BASED_OUTPATIENT_CLINIC_OR_DEPARTMENT_OTHER): Payer: Self-pay | Admitting: Surgery

## 2022-04-05 ENCOUNTER — Encounter (HOSPITAL_BASED_OUTPATIENT_CLINIC_OR_DEPARTMENT_OTHER)
Admission: RE | Disposition: A | Discharge status: Home / Self Care | Payer: Self-pay | Source: Home / Self Care | Attending: Surgery

## 2022-04-05 ENCOUNTER — Other Ambulatory Visit: Payer: Self-pay

## 2022-04-05 ENCOUNTER — Ambulatory Visit (HOSPITAL_BASED_OUTPATIENT_CLINIC_OR_DEPARTMENT_OTHER)
Admission: RE | Admit: 2022-04-05 | Discharge: 2022-04-05 | Disposition: A | Discharge status: Home / Self Care | Payer: BC Managed Care – PPO | Attending: Surgery | Admitting: Surgery

## 2022-04-05 DIAGNOSIS — G4730 Sleep apnea, unspecified: Secondary | ICD-10-CM | POA: Diagnosis not present

## 2022-04-05 DIAGNOSIS — M7989 Other specified soft tissue disorders: Secondary | ICD-10-CM | POA: Diagnosis not present

## 2022-04-05 DIAGNOSIS — L739 Follicular disorder, unspecified: Secondary | ICD-10-CM | POA: Diagnosis not present

## 2022-04-05 DIAGNOSIS — R599 Enlarged lymph nodes, unspecified: Secondary | ICD-10-CM | POA: Insufficient documentation

## 2022-04-05 DIAGNOSIS — R221 Localized swelling, mass and lump, neck: Secondary | ICD-10-CM | POA: Diagnosis not present

## 2022-04-05 DIAGNOSIS — R590 Localized enlarged lymph nodes: Secondary | ICD-10-CM | POA: Diagnosis not present

## 2022-04-05 DIAGNOSIS — R222 Localized swelling, mass and lump, trunk: Secondary | ICD-10-CM | POA: Diagnosis not present

## 2022-04-05 HISTORY — DX: Sleep apnea, unspecified: G47.30

## 2022-04-05 HISTORY — PX: MASS EXCISION: SHX2000

## 2022-04-05 SURGERY — EXCISION MASS
Anesthesia: Monitor Anesthesia Care | Site: Chest

## 2022-04-05 MED ORDER — LIDOCAINE HCL (CARDIAC) PF 100 MG/5ML IV SOSY
PREFILLED_SYRINGE | INTRAVENOUS | Status: DC | PRN
  Administered 2022-04-05: 60 mg via INTRAVENOUS

## 2022-04-05 MED ORDER — KETAMINE HCL 50 MG/5ML IJ SOSY
PREFILLED_SYRINGE | INTRAMUSCULAR | Status: AC
  Filled 2022-04-05: qty 5

## 2022-04-05 MED ORDER — ONDANSETRON HCL 4 MG/2ML IJ SOLN
INTRAMUSCULAR | Status: DC | PRN
  Administered 2022-04-05: 4 mg via INTRAVENOUS

## 2022-04-05 MED ORDER — IBUPROFEN 800 MG PO TABS: 800.0000 mg | ORAL_TABLET | Freq: Three times a day (TID) | ORAL | 0 refills | Status: AC | PRN

## 2022-04-05 MED ORDER — LACTATED RINGERS IV SOLN: INTRAVENOUS | Status: DC

## 2022-04-05 MED ORDER — KETAMINE HCL 10 MG/ML IJ SOLN
INTRAMUSCULAR | Status: DC | PRN
  Administered 2022-04-05 (×3): 10 mg via INTRAVENOUS

## 2022-04-05 MED ORDER — ENOXAPARIN SODIUM 40 MG/0.4ML IJ SOSY
PREFILLED_SYRINGE | INTRAMUSCULAR | Status: AC
  Filled 2022-04-05: qty 0.4

## 2022-04-05 MED ORDER — CEFAZOLIN SODIUM-DEXTROSE 2-4 GM/100ML-% IV SOLN: 2.0000 g | INTRAVENOUS | Status: DC

## 2022-04-05 MED ORDER — ACETAMINOPHEN 500 MG PO TABS: 1000.0000 mg | ORAL_TABLET | Freq: Four times a day (QID) | ORAL | 0 refills | Status: AC

## 2022-04-05 MED ORDER — PROPOFOL 500 MG/50ML IV EMUL
INTRAVENOUS | Status: DC | PRN
  Administered 2022-04-05: 150 ug/kg/min via INTRAVENOUS

## 2022-04-05 MED ORDER — PROPOFOL 10 MG/ML IV BOLUS
INTRAVENOUS | Status: AC
  Filled 2022-04-05: qty 20

## 2022-04-05 MED ORDER — ONDANSETRON HCL 4 MG/2ML IJ SOLN: 4.0000 mg | Freq: Four times a day (QID) | INTRAMUSCULAR | Status: DC | PRN

## 2022-04-05 MED ORDER — FENTANYL CITRATE (PF) 100 MCG/2ML IJ SOLN: 25.0000 ug | INTRAMUSCULAR | Status: DC | PRN

## 2022-04-05 MED ORDER — DIPHENHYDRAMINE HCL 25 MG PO CAPS
ORAL_CAPSULE | ORAL | Status: AC
  Filled 2022-04-05: qty 1

## 2022-04-05 MED ORDER — OXYCODONE HCL 5 MG PO TABS: 5.0000 mg | ORAL_TABLET | ORAL | 0 refills | Status: AC | PRN

## 2022-04-05 MED ORDER — MIDAZOLAM HCL 5 MG/5ML IJ SOLN
INTRAMUSCULAR | Status: DC | PRN
  Administered 2022-04-05: 1 mg via INTRAVENOUS
  Administered 2022-04-05: 2 mg via INTRAVENOUS
  Administered 2022-04-05: 1 mg via INTRAVENOUS

## 2022-04-05 MED ORDER — OXYCODONE HCL 5 MG/5ML PO SOLN: 5.0000 mg | Freq: Once | ORAL | Status: DC | PRN

## 2022-04-05 MED ORDER — VANCOMYCIN HCL IN DEXTROSE 1-5 GM/200ML-% IV SOLN
1000.0000 mg | Freq: Once | INTRAVENOUS | Status: AC
  Administered 2022-04-05: 1000 mg via INTRAVENOUS

## 2022-04-05 MED ORDER — FENTANYL CITRATE (PF) 100 MCG/2ML IJ SOLN
INTRAMUSCULAR | Status: DC | PRN
  Administered 2022-04-05 (×4): 25 ug via INTRAVENOUS

## 2022-04-05 MED ORDER — METHOCARBAMOL 750 MG PO TABS: 750.0000 mg | ORAL_TABLET | Freq: Four times a day (QID) | ORAL | 0 refills | Status: AC

## 2022-04-05 MED ORDER — MIDAZOLAM HCL 2 MG/2ML IJ SOLN
INTRAMUSCULAR | Status: AC
  Filled 2022-04-05: qty 2

## 2022-04-05 MED ORDER — ONDANSETRON HCL 4 MG/2ML IJ SOLN
INTRAMUSCULAR | Status: AC
  Filled 2022-04-05: qty 2

## 2022-04-05 MED ORDER — LIDOCAINE HCL (PF) 2 % IJ SOLN
INTRAMUSCULAR | Status: AC
  Filled 2022-04-05: qty 5

## 2022-04-05 MED ORDER — ENOXAPARIN SODIUM 40 MG/0.4ML IJ SOSY
40.0000 mg | PREFILLED_SYRINGE | Freq: Once | INTRAMUSCULAR | Status: AC
  Administered 2022-04-05: 40 mg via SUBCUTANEOUS

## 2022-04-05 MED ORDER — FENTANYL CITRATE (PF) 100 MCG/2ML IJ SOLN
INTRAMUSCULAR | Status: AC
  Filled 2022-04-05: qty 2

## 2022-04-05 MED ORDER — OXYCODONE HCL 5 MG PO TABS: 5.0000 mg | ORAL_TABLET | Freq: Once | ORAL | Status: DC | PRN

## 2022-04-05 MED ORDER — BUPIVACAINE HCL (PF) 0.25 % IJ SOLN
INTRAMUSCULAR | Status: DC | PRN
  Administered 2022-04-05: 15 mL

## 2022-04-05 MED ORDER — CEFAZOLIN SODIUM-DEXTROSE 2-4 GM/100ML-% IV SOLN
INTRAVENOUS | Status: AC
  Filled 2022-04-05: qty 100

## 2022-04-05 MED ORDER — CHLORHEXIDINE GLUCONATE CLOTH 2 % EX PADS: 6.0000 | MEDICATED_PAD | Freq: Once | CUTANEOUS | Status: DC

## 2022-04-05 MED ORDER — 0.9 % SODIUM CHLORIDE (POUR BTL) OPTIME
TOPICAL | Status: DC | PRN
  Administered 2022-04-05: 500 mL

## 2022-04-05 MED ORDER — DIPHENHYDRAMINE HCL 25 MG PO CAPS
25.0000 mg | ORAL_CAPSULE | Freq: Once | ORAL | Status: AC
  Administered 2022-04-05: 25 mg via ORAL

## 2022-04-05 MED ORDER — BUPIVACAINE LIPOSOME 1.3 % IJ SUSP
INTRAMUSCULAR | Status: DC | PRN
  Administered 2022-04-05: 15 mL

## 2022-04-05 SURGICAL SUPPLY — 51 items
ADH SKN CLS APL DERMABOND .7 (GAUZE/BANDAGES/DRESSINGS) ×1
APL PRP STRL LF DISP 70% ISPRP (MISCELLANEOUS) ×1
BLADE CLIPPER SENSICLIP SURGIC (BLADE) IMPLANT
BLADE SURG 10 STRL SS (BLADE) ×1 IMPLANT
BLADE SURG 15 STRL LF DISP TIS (BLADE) ×1 IMPLANT
BLADE SURG 15 STRL SS (BLADE) ×1
CHLORAPREP W/TINT 26 (MISCELLANEOUS) ×1 IMPLANT
COVER BACK TABLE 60X90IN (DRAPES) ×1 IMPLANT
COVER MAYO STAND STRL (DRAPES) ×1 IMPLANT
DERMABOND ADVANCED .7 DNX12 (GAUZE/BANDAGES/DRESSINGS) ×1 IMPLANT
DRAPE LAPAROTOMY 100X72 PEDS (DRAPES) ×1 IMPLANT
DRAPE UTILITY XL STRL (DRAPES) ×1 IMPLANT
DRSG TEGADERM 4X4.75 (GAUZE/BANDAGES/DRESSINGS) IMPLANT
ELECT COATED BLADE 2.86 ST (ELECTRODE) IMPLANT
ELECT NDL BLADE 2-5/6 (NEEDLE) IMPLANT
ELECT NEEDLE BLADE 2-5/6 (NEEDLE) ×1 IMPLANT
ELECT REM PT RETURN 9FT ADLT (ELECTROSURGICAL) ×1
ELECTRODE REM PT RTRN 9FT ADLT (ELECTROSURGICAL) ×1 IMPLANT
GAUZE 4X4 16PLY ~~LOC~~+RFID DBL (SPONGE) ×1 IMPLANT
GAUZE PACKING IODOFORM 1/4X15 (PACKING) IMPLANT
GAUZE SPONGE 4X4 12PLY STRL (GAUZE/BANDAGES/DRESSINGS) IMPLANT
GLOVE BIO SURGEON STRL SZ 6.5 (GLOVE) ×1 IMPLANT
GLOVE BIOGEL PI IND STRL 6 (GLOVE) ×1 IMPLANT
GOWN STRL REUS W/TWL LRG LVL3 (GOWN DISPOSABLE) ×2 IMPLANT
KIT MARKER MARGIN INK (KITS) IMPLANT
KIT TURNOVER CYSTO (KITS) ×1 IMPLANT
NDL HYPO 25X1 1.5 SAFETY (NEEDLE) ×1 IMPLANT
NEEDLE HYPO 25X1 1.5 SAFETY (NEEDLE) ×1 IMPLANT
NS IRRIG 500ML POUR BTL (IV SOLUTION) IMPLANT
PACK BASIN DAY SURGERY FS (CUSTOM PROCEDURE TRAY) ×1 IMPLANT
PENCIL SMOKE EVACUATOR (MISCELLANEOUS) ×1 IMPLANT
SLEEVE SCD COMPRESS KNEE MED (STOCKING) ×1 IMPLANT
SPIKE FLUID TRANSFER (MISCELLANEOUS) IMPLANT
SPONGE T-LAP 18X18 ~~LOC~~+RFID (SPONGE) ×1 IMPLANT
STRIP CLOSURE SKIN 1/2X4 (GAUZE/BANDAGES/DRESSINGS) ×1 IMPLANT
SUT ETHILON 2 0 FS 18 (SUTURE) IMPLANT
SUT MNCRL AB 4-0 PS2 18 (SUTURE) ×1 IMPLANT
SUT SILK 2 0 SH (SUTURE) IMPLANT
SUT VIC AB 2-0 SH 27 (SUTURE)
SUT VIC AB 2-0 SH 27XBRD (SUTURE) IMPLANT
SUT VIC AB 3-0 SH 27 (SUTURE) ×1
SUT VIC AB 3-0 SH 27X BRD (SUTURE) IMPLANT
SUT VICRYL 3-0 CR8 SH (SUTURE) IMPLANT
SWAB COLLECTION DEVICE MRSA (MISCELLANEOUS) IMPLANT
SWAB CULTURE ESWAB REG 1ML (MISCELLANEOUS) IMPLANT
SYR BULB IRRIG 60ML STRL (SYRINGE) ×1 IMPLANT
SYR CONTROL 10ML LL (SYRINGE) ×1 IMPLANT
TOWEL OR 17X26 10 PK STRL BLUE (TOWEL DISPOSABLE) ×1 IMPLANT
TUBE CONNECTING 12X1/4 (SUCTIONS) IMPLANT
UNDERPAD 30X36 HEAVY ABSORB (UNDERPADS AND DIAPERS) IMPLANT
YANKAUER SUCT BULB TIP NO VENT (SUCTIONS) IMPLANT

## 2022-04-05 NOTE — H&P (Signed)
     Gordon Rosales is an 62 y.o. male.   HPI: 9M with soft tissue mass x2 of R chest wall. Plan for excision. The patient has had no hospitalizations, doctors visits, ER visits, surgeries, or newly diagnosed allergies since being seen in the office.    Past Medical History:  Diagnosis Date   Sleep apnea     Past Surgical History:  Procedure Laterality Date   NO PAST SURGERIES     WRIST SURGERY      History reviewed. No pertinent family history.  Social History:  reports that he has never smoked. He has never used smokeless tobacco. He reports current alcohol use. He reports that he does not use drugs.  Allergies:  Allergies  Allergen Reactions   Penicillins Other (See Comments)    Did it involve swelling of the face/tongue/throat, SOB, or low BP? No Did it involve sudden or severe rash/hives, skin peeling, or any reaction on the inside of your mouth or nose? No Did you need to seek medical attention at a hospital or doctor's office? No When did it last happen?       If all above answers are "NO", may proceed with cephalosporin use.  Seizures      Medications: I have reviewed the patient's current medications.  No results found for this or any previous visit (from the past 48 hour(s)).  No results found.  ROS 10 point review of systems is negative except as listed above in HPI.   Physical Exam Blood pressure 125/72, pulse (!) 50, temperature 98.4 F (36.9 C), temperature source Oral, resp. rate 16, height 6\' 3"  (1.905 m), weight 107.2 kg, SpO2 97 %. Constitutional: well-developed, well-nourished HEENT: pupils equal, round, reactive to light, 79mm b/l, moist conjunctiva, external inspection of ears and nose normal, hearing intact Oropharynx: normal oropharyngeal mucosa, normal dentition Neck: no thyromegaly, trachea midline, no midline cervical tenderness to palpation Chest: breath sounds equal bilaterally, normal respiratory effort, no midline or lateral chest  wall tenderness to palpation/deformity, soft tissue mass x2 of R chest wall  Abdomen: soft, NT, no bruising, no hepatosplenomegaly GU: no blood at urethral meatus of penis, no scrotal masses or abnormality  Back: no wounds, no thoracic/lumbar spine tenderness to palpation, no thoracic/lumbar spine stepoffs Rectal: deferred Extremities: 2+ radial and pedal pulses bilaterally, intact motor and sensation bilateral UE and LE, no peripheral edema MSK: normal gait/station, no clubbing/cyanosis of fingers/toes, normal ROM of all four extremities Skin: warm, dry, no rashes Psych: normal memory, normal mood/affect     Assessment/Plan: 9M with soft tissue mass of R chest wall. Plan for excision today.    Jesusita Oka, MD General and [facility]

## 2022-04-05 NOTE — Transfer of Care (Addendum)
Immediate Anesthesia Transfer of Care Note  Patient: Engineer, maintenance (IT)  Procedure(s) Performed: Procedure(s) (LRB): EXCISION OF SOFT TISSUE MASS x2 (N/A)  Patient Location: PACU  Anesthesia Type: MAC  Level of Consciousness: awake, sedated, patient cooperative and responds to stimulation  Airway & Oxygen Therapy: Patient Spontanous Breathing and Patient connected to FM oxygen. Placed O/A in PACU on FM non rebreather FM 100 % 02. RN and CRNA at bedside- more awake from sedation    Post-op Assessment: Report given to PACU RN, Post -op Vital signs reviewed and stable and Patient moving all extremities  Post vital signs: Reviewed and stable  Complications: No apparent anesthesia complications

## 2022-04-05 NOTE — Op Note (Signed)
   Operative Note   Date: 04/05/2022  Procedure: excision of soft tissue mass x2, right chest wall  Pre-op diagnosis: soft tissue mass x2, right chest wall 1x1.5cm and 0.5x0.5cm Post-op diagnosis: same  Indication and clinical history: The patient is a 62 y.o. year old male with soft tissue mass x2, right chest wall     Surgeon: Jesusita Oka, MD  Anesthesiologist: Marcie Bal, MD Anesthesia: General  Findings:  Specimen: soft tissue mass, right chest wall-superior and inferior EBL: <5cc Drains/Implants: none  Disposition: PACU - hemodynamically stable.  Description of procedure: The patient was positioned supine on the operating room table. General anesthetic induction and intubation were uneventful. Foley catheter insertion was performed and was atraumatic. Time-out was performed verifying correct patient, procedure, signature of informed consent, and administration of pre-operative antibiotics. The right chest wall was prepped and draped in the usual sterile fashion.  An elliptical incision was made around each soft tissue mass, following by excision using electrocautery. The wounds were irrigated and local anesthetic infiltrated. Meticulous hemostasis was confirmed. The wounds were each closed with a layer of 3-0 vicryl deep and a subcuticular 4-0 monocryl. Dermabond was used as sterile dressing.   All sponge and instrument counts were correct at the conclusion of the procedure. The patient was awakened from anesthesia, extubated uneventfully, and transported to the PACU in good condition. There were no complications.    Jesusita Oka, MD General and [facility]

## 2022-04-05 NOTE — Anesthesia Procedure Notes (Signed)
Procedure Name: MAC Date/Time: 04/05/2022 9:29 AM  Performed by: Justice Rocher, CRNAPre-anesthesia Checklist: Timeout performed, Patient being monitored, Patient identified, Emergency Drugs available and Suction available Patient Re-evaluated:Patient Re-evaluated prior to induction Oxygen Delivery Method: Simple face mask Preoxygenation: Pre-oxygenation with 100% oxygen Ventilation: Oral airway inserted - appropriate to patient size Placement Confirmation: breath sounds checked- equal and bilateral, CO2 detector and positive ETCO2

## 2022-04-05 NOTE — Discharge Instructions (Addendum)
May shower beginning 04/06/2022. Do not peel off or scrub skin glue. May allow warm soapy water to run over incision, then rinse and pat dry. Do not soak the wound in any water (tubs, hot tubs, pools, lakes, oceans) for one week.   No lifting your arms above shoulder level or lifting more than 10 pounds for one week. May resume sexual activity when it is comfortable.   Pain regimen: take over-the-counter tylenol (acetaminophen) 1000mg  every six hours, the prescription ibuprofen (600mg ) every six hours and the robaxin (methocarbamol) 750mg  every six hours. With all three of these, you should be taking something every two hours. Example: tylenol ( acetaminophen) at 8am, ibuprofen at 10am, robaxin (methocarbamol) at 12pm, tylenol (acetaminophen) again at 2pm, ibuprofen again at 4pm, robaxin (methocarbamol) at 6pm. You also have a prescription for oxycodone, which should be taken if the tylenol (acetaminophen), ibuprofen, and robaxin (methocarbamol) are not enough to control your pain. You may take the oxycodone as frequently as every four hours as needed, but if you are taking the other medications as above, you should not need the oxycodone this frequently. You have also been given a prescription for colace (docusate) which is a stool softener. Please take this as prescribed because the oxycodone can cause constipation and the colace (docusate) will minimize or prevent constipation. Do not drive while taking or under the influence of the oxycodone as it is a narcotic medication.  Call the office at [phone] for temperature greater than 101.29F, worsening pain, redness or warmth at the incision site.  Please call [phone] to make an appointment for 2-3 weeks after surgery for wound check.   Information for Discharge Teaching: EXPAREL (bupivacaine liposome injectable suspension)   Your surgeon or anesthesiologist gave you EXPAREL(bupivacaine) to help control your pain after surgery.  EXPAREL is a  local anesthetic that provides pain relief by numbing the tissue around the surgical site. EXPAREL is designed to release pain medication over time and can control pain for up to 72 hours. Depending on how you respond to EXPAREL, you may require less pain medication during your recovery.  Possible side effects: Temporary loss of sensation or ability to move in the area where bupivacaine was injected. Nausea, vomiting, constipation Rarely, numbness and tingling in your mouth or lips, lightheadedness, or anxiety may occur. Call your doctor right away if you think you may be experiencing any of these sensations, or if you have other questions regarding possible side effects.  Follow all other discharge instructions given to you by your surgeon or nurse. Eat a healthy diet and drink plenty of water or other fluids.  If you return to the hospital for any reason within 96 hours following the administration of EXPAREL, it is important for health care providers to know that you have received this anesthetic. A teal colored band has been placed on your arm with the date, time and amount of EXPAREL you have received in order to alert and inform your health care providers. Please leave this armband in place for the full 96 hours following administration, and then you may remove the band.     Post Anesthesia Home Care Instructions  Activity: Get plenty of rest for the remainder of the day. A responsible individual must stay with you for 24 hours following the procedure.  For the next 24 hours, DO NOT: -Drive a car -Paediatric nurse -Drink alcoholic beverages -Take any medication unless instructed by your physician -Make any legal decisions or sign important papers.  Meals: Start with liquid foods such as gelatin or soup. Progress to regular foods as tolerated. Avoid greasy, spicy, heavy foods. If nausea and/or vomiting occur, drink only clear liquids until the nausea and/or vomiting subsides. Call  your physician if vomiting continues.  Special Instructions/Symptoms: Your throat may feel dry or sore from the anesthesia or the breathing tube placed in your throat during surgery. If this causes discomfort, gargle with warm salt water. The discomfort should disappear within 24 hours.

## 2022-04-05 NOTE — Anesthesia Preprocedure Evaluation (Signed)
Anesthesia Evaluation  Patient identified by MRN, date of birth, ID band Patient awake    Reviewed: Allergy & Precautions, H&P , NPO status , Patient's Chart, lab work & pertinent test results  Airway Mallampati: II   Neck ROM: full    Dental   Pulmonary sleep apnea    breath sounds clear to auscultation       Cardiovascular negative cardio ROS  Rhythm:regular Rate:Normal     Neuro/Psych    GI/Hepatic   Endo/Other    Renal/GU      Musculoskeletal   Abdominal   Peds  Hematology   Anesthesia Other Findings   Reproductive/Obstetrics                             Anesthesia Physical Anesthesia Plan  ASA: 2  Anesthesia Plan: MAC   Post-op Pain Management:    Induction: Intravenous  PONV Risk Score and Plan: 1 and Midazolam, Propofol infusion, Treatment may vary due to age or medical condition and Ondansetron  Airway Management Planned: Simple Face Mask  Additional Equipment:   Intra-op Plan:   Post-operative Plan:   Informed Consent: I have reviewed the patients History and Physical, chart, labs and discussed the procedure including the risks, benefits and alternatives for the proposed anesthesia with the patient or authorized representative who has indicated his/her understanding and acceptance.     Dental advisory given  Plan Discussed with: CRNA, Anesthesiologist and Surgeon  Anesthesia Plan Comments:        Anesthesia Quick Evaluation

## 2022-04-08 ENCOUNTER — Encounter (HOSPITAL_BASED_OUTPATIENT_CLINIC_OR_DEPARTMENT_OTHER): Payer: Self-pay | Admitting: Surgery

## 2022-04-08 NOTE — Anesthesia Postprocedure Evaluation (Signed)
Anesthesia Post Note  Patient: Engineer, maintenance (IT)  Procedure(s) Performed: EXCISION OF SOFT TISSUE MASS x2 (Chest)     Patient location during evaluation: PACU Anesthesia Type: MAC Level of consciousness: awake and alert Pain management: pain level controlled Vital Signs Assessment: post-procedure vital signs reviewed and stable Respiratory status: spontaneous breathing, nonlabored ventilation, respiratory function stable and patient connected to nasal cannula oxygen Cardiovascular status: blood pressure returned to baseline and stable Postop Assessment: no apparent nausea or vomiting Anesthetic complications: no   No notable events documented.  Last Vitals:  Vitals:   04/05/22 1045 04/05/22 1100  BP: 103/79 111/63  Pulse: (!) 50 (!) 49  Resp: 13 (!) 21  Temp:  (!) 36.2 C  SpO2: 96% 98%    Last Pain:  Vitals:   04/05/22 1100  TempSrc:   PainSc: 0-No pain                 Melissa Pulido S

## 2022-04-11 LAB — SURGICAL PATHOLOGY

## 2022-05-15 DIAGNOSIS — R942 Abnormal results of pulmonary function studies: Secondary | ICD-10-CM | POA: Diagnosis not present

## 2022-05-15 DIAGNOSIS — R918 Other nonspecific abnormal finding of lung field: Secondary | ICD-10-CM | POA: Diagnosis not present

## 2022-05-15 DIAGNOSIS — Z8551 Personal history of malignant neoplasm of bladder: Secondary | ICD-10-CM | POA: Diagnosis not present

## 2022-05-15 DIAGNOSIS — R0602 Shortness of breath: Secondary | ICD-10-CM | POA: Diagnosis not present

## 2022-08-16 DIAGNOSIS — R918 Other nonspecific abnormal finding of lung field: Secondary | ICD-10-CM | POA: Diagnosis not present

## 2022-08-16 DIAGNOSIS — R0602 Shortness of breath: Secondary | ICD-10-CM | POA: Diagnosis not present

## 2022-08-22 DIAGNOSIS — G4733 Obstructive sleep apnea (adult) (pediatric): Secondary | ICD-10-CM | POA: Diagnosis not present

## 2022-09-21 DIAGNOSIS — G4733 Obstructive sleep apnea (adult) (pediatric): Secondary | ICD-10-CM | POA: Diagnosis not present

## 2022-10-09 DIAGNOSIS — C679 Malignant neoplasm of bladder, unspecified: Secondary | ICD-10-CM | POA: Diagnosis not present

## 2022-10-22 DIAGNOSIS — G4733 Obstructive sleep apnea (adult) (pediatric): Secondary | ICD-10-CM | POA: Diagnosis not present

## 2022-11-22 DIAGNOSIS — G4733 Obstructive sleep apnea (adult) (pediatric): Secondary | ICD-10-CM | POA: Diagnosis not present

## 2022-12-08 IMAGING — MR MR KNEE*R* W/O CM
7 series · 40 of 40 positions shown · non-contrast
Comparison: Right knee radiographs 01/23/2021

CLINICAL DATA: Right knee pain and swelling radiating to the ankle.

EXAM:
MRI OF THE RIGHT KNEE WITHOUT CONTRAST
TECHNIQUE: Multiplanar, multisequence MR imaging of the knee was performed. No
intravenous contrast was administered.

[Series 6: T2 fat-sat · axial · right · 4.0mm · 0.62mm/px · z∈[-86,+68]mm · 7 of 36 slices shown (1 of 3)]
[im 1/36]
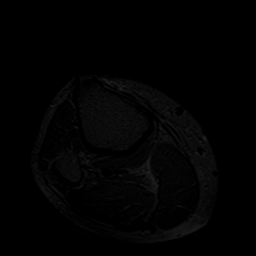
[im 6/36]
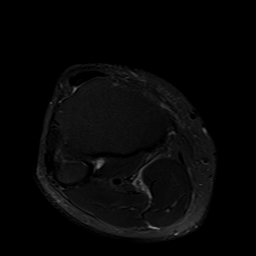
[im 12/36]
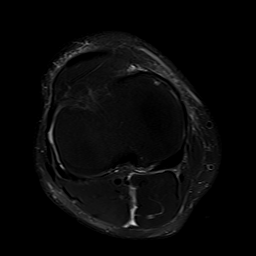
[im 18/36]
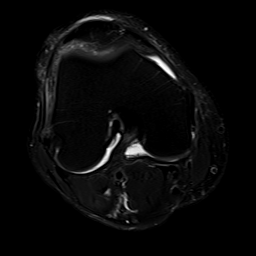
[im 24/36]
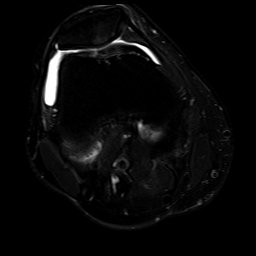
[im 30/36]
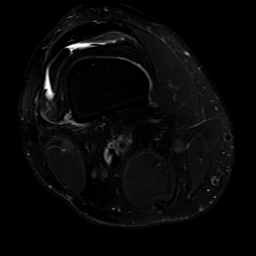
[im 36/36]
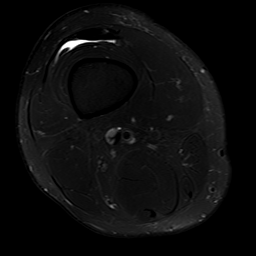

[Series 7: T2 fat-sat · coronal · right · 4.0mm · 0.59mm/px · 6 of 33 slices shown (2 of 3)]
[im 1/33]
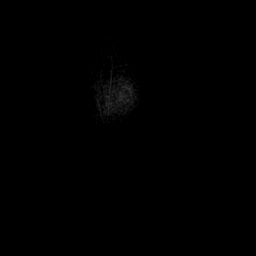
[im 7/33]
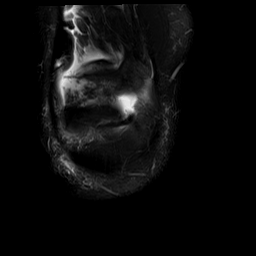
[im 13/33]
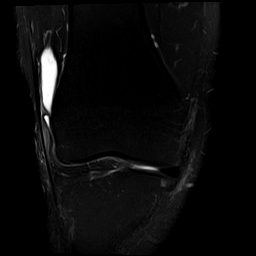
[im 20/33]
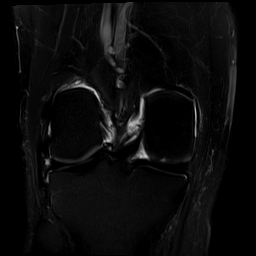
[im 26/33]
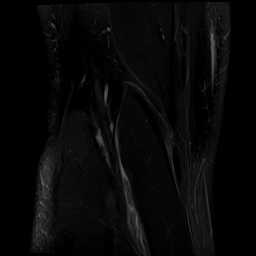
[im 33/33]
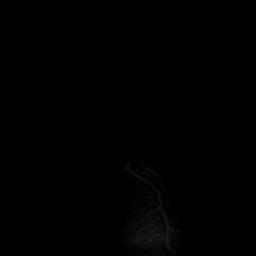

[Series 8: T1 · coronal · right · 4.0mm · 0.59mm/px · 6 of 33 slices shown]
[im 1/33]
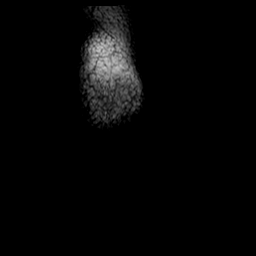
[im 7/33]
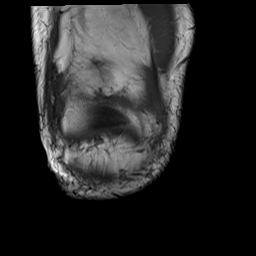
[im 13/33]
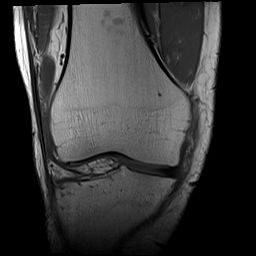
[im 20/33]
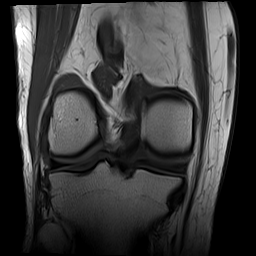
[im 26/33]
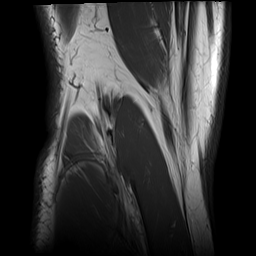
[im 33/33]
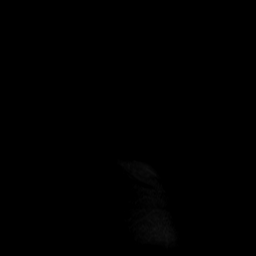

[Series 9: PD fat-sat · coronal · right · 3.0mm · 0.47mm/px · 5 of 28 slices shown (1 of 2)]
[im 1/28]
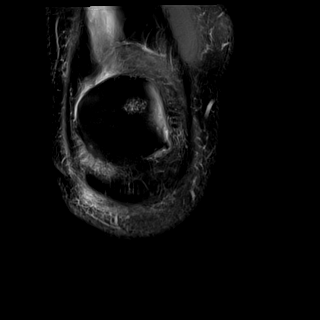
[im 7/28]
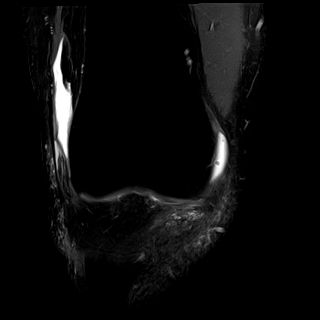
[im 14/28]
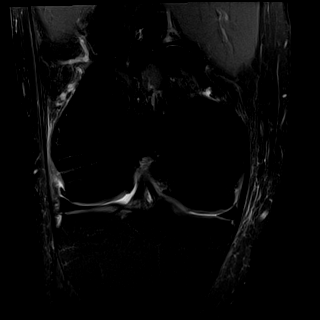
[im 21/28]
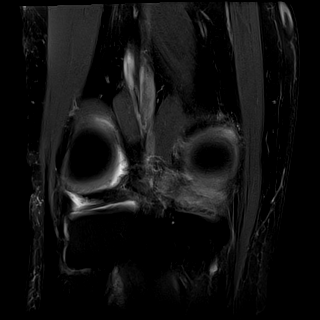
[im 28/28]
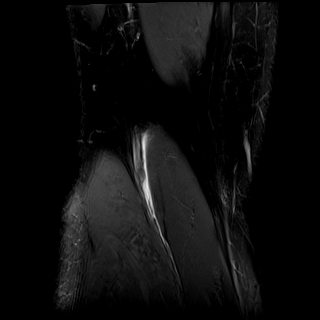

[Series 10: PD fat-sat · sagittal · right · 3.0mm · 0.62mm/px · 6 of 34 slices shown (2 of 2)]
[im 1/34]
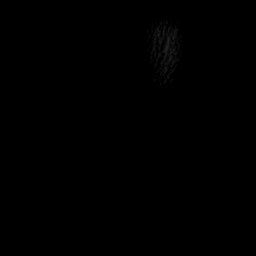
[im 7/34]
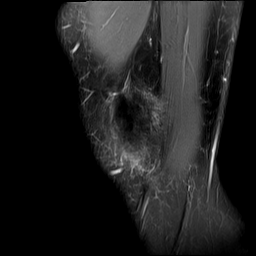
[im 14/34]
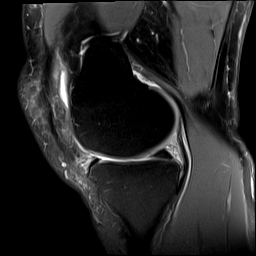
[im 20/34]
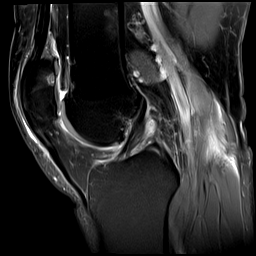
[im 27/34]
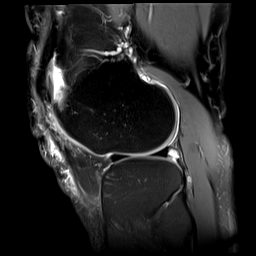
[im 34/34]
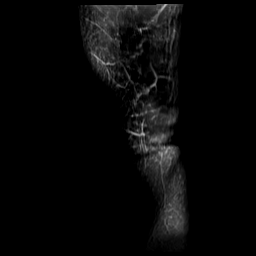

[Series 11: T2 fat-sat · sagittal · right · 3.0mm · 0.62mm/px · 6 of 35 slices shown (3 of 3)]
[im 1/35]
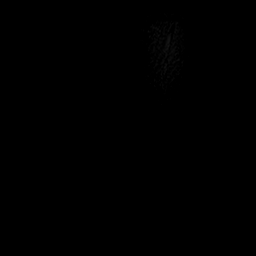
[im 7/35]
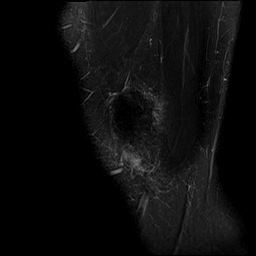
[im 14/35]
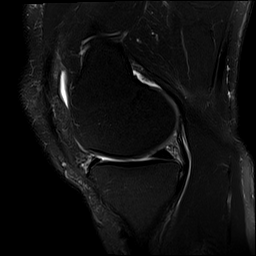
[im 21/35]
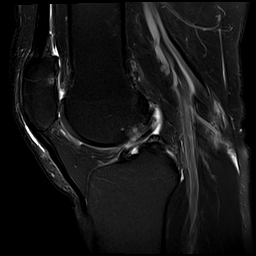
[im 28/35]
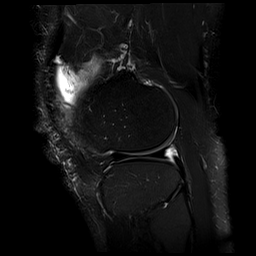
[im 35/35]
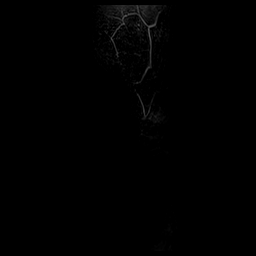

[Series 13: PD · coronal · right · 1.5mm · 0.44mm/px · 4 of 23 slices shown]
[im 1/23]
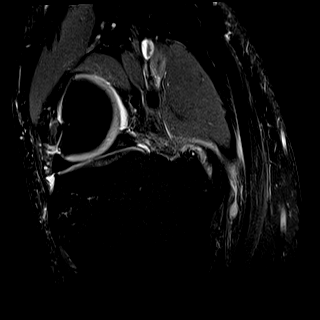
[im 8/23]
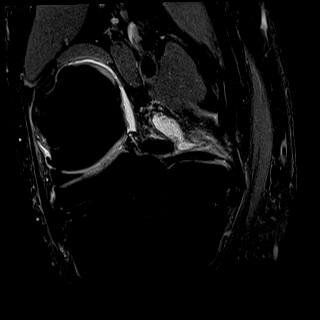
[im 15/23]
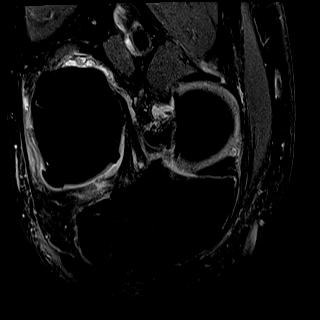
[im 23/23]
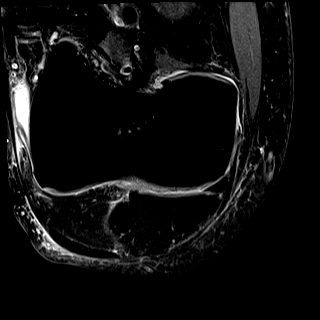

[40 of 40 positions shown; findings below may reference images not displayed]

FINDINGS: MENISCI

Medial meniscus: There is intermediate proton density signal within
the peripheral inferior aspect of the posterior horn of the medial
meniscus with a small linear component that comes close to the
inferior articular surface of the junction of the middle and
peripheral thirds of the meniscal triangle (sagittal image 12). This
may represent a tiny focal tear, however no definite breach is seen
of the inferior meniscal surface. There is also mild linear
increased signal that contacts the posterior wall of the more medial
aspect of the posterior horn (sagittal image 10). Tiny oblique tear
extending through the inferior articular surface of the peripheral
third of meniscal triangle of the posterior segment of the body of
the medial meniscus (coronal series 9 images 12 and 13).

Lateral meniscus: There is intermediate signal intrasubstance
degeneration within the posterior horn of the lateral meniscus. No
tear is seen extending through the articular surface of the lateral
meniscus.

LIGAMENTS

Cruciates: The ACL and PCL are intact.

Collaterals: The medial collateral ligament is intact. The fibular
collateral ligament, biceps femoris tendon, iliotibial band, and
popliteus tendon are intact.

CARTILAGE

Patellofemoral: Moderate thinning of the patellar apex cartilage
with moderate subchondral cystic change superiorly. Mild thinning of
the medial trochlear cartilage.

Medial: Full-thickness cartilage loss at the junction of the
posterior weight-bearing and posterior nonweightbearing medial
femoral condyle cartilage in the region measuring up to 8 mm in AP
dimension and 3 mm in transverse dimension (sagittal series 11,
image 12). Mild thinning of the far anterior medial aspect of the
medial tibial plateau cartilage with mild subchondral cystic change.

Lateral:  Intact.

Joint: Smalljoint effusion. Moderate to high-grade edema within the
superolateral aspect of Hoffa's fat pad as can be seen with
infrapatellar fat pad impingement. The tibial tuberosity-trochlear
groove distance measures 15 mm, at the upper limits of normal.

Popliteal Fossa:  No Baker's cyst.

Extensor Mechanism: Intact quadriceps tendon and patellar tendon.
Mild fluid within the prepatellar bursa with mild surrounding
subcutaneous fat edema.

Bones: Mild peripheral lateral compartment degenerative osteophytes.

Other: None.
IMPRESSION: :
IMPRESSION: 1. Moderate patellofemoral and medial compartment cartilage
degenerative changes.
2. Tiny undersurface tear of the peripheral third of the meniscal
triangle of the posterior segment of the body of the medial
meniscus.
3. Small joint effusion.
4. Mild prepatellar bursitis.

## 2022-12-22 DIAGNOSIS — G4733 Obstructive sleep apnea (adult) (pediatric): Secondary | ICD-10-CM | POA: Diagnosis not present

## 2023-01-22 DIAGNOSIS — G4733 Obstructive sleep apnea (adult) (pediatric): Secondary | ICD-10-CM | POA: Diagnosis not present

## 2023-02-20 DIAGNOSIS — G4733 Obstructive sleep apnea (adult) (pediatric): Secondary | ICD-10-CM | POA: Diagnosis not present

## 2023-10-08 ENCOUNTER — Other Ambulatory Visit (HOSPITAL_BASED_OUTPATIENT_CLINIC_OR_DEPARTMENT_OTHER): Payer: Self-pay | Admitting: Internal Medicine

## 2023-10-08 DIAGNOSIS — E785 Hyperlipidemia, unspecified: Secondary | ICD-10-CM

## 2023-10-27 ENCOUNTER — Ambulatory Visit (HOSPITAL_COMMUNITY)
Admission: RE | Admit: 2023-10-27 | Discharge: 2023-10-27 | Disposition: A | Discharge status: Home / Self Care | Payer: Self-pay | Source: Ambulatory Visit | Attending: Internal Medicine | Admitting: Internal Medicine

## 2023-10-27 DIAGNOSIS — E785 Hyperlipidemia, unspecified: Secondary | ICD-10-CM | POA: Insufficient documentation
